# Patient Record
Sex: Female | Born: 1990 | Race: White | Hispanic: No | Marital: Single
Health system: Southern US, Community
[De-identification: ages and names within clinical notes are randomized; demographics above are authoritative.]

## PROBLEM LIST (undated history)

## (undated) HISTORY — PX: NASAL SINUS SURGERY: SHX719

## (undated) HISTORY — PX: ANTERIOR CRUCIATE LIGAMENT REPAIR: SHX115

## (undated) HISTORY — PX: CHOLECYSTECTOMY: SHX55

---
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
## 2010-04-05 DIAGNOSIS — M6750 Plica syndrome, unspecified knee: Secondary | ICD-10-CM | POA: Insufficient documentation

## 2012-02-28 DIAGNOSIS — Z9889 Other specified postprocedural states: Secondary | ICD-10-CM | POA: Insufficient documentation

## 2014-07-02 DIAGNOSIS — J0191 Acute recurrent sinusitis, unspecified: Secondary | ICD-10-CM | POA: Insufficient documentation

## 2014-07-02 DIAGNOSIS — J329 Chronic sinusitis, unspecified: Secondary | ICD-10-CM | POA: Insufficient documentation

## 2014-07-02 DIAGNOSIS — J3501 Chronic tonsillitis: Secondary | ICD-10-CM | POA: Insufficient documentation

## 2016-12-24 DIAGNOSIS — K219 Gastro-esophageal reflux disease without esophagitis: Secondary | ICD-10-CM | POA: Insufficient documentation

## 2019-12-03 ENCOUNTER — Inpatient Hospital Stay (HOSPITAL_COMMUNITY): Admission: RE | Admit: 2019-12-03 | Payer: Self-pay | Source: Ambulatory Visit

## 2019-12-03 ENCOUNTER — Ambulatory Visit: Payer: Self-pay

## 2019-12-04 ENCOUNTER — Other Ambulatory Visit: Payer: Self-pay

## 2019-12-04 ENCOUNTER — Ambulatory Visit
Admission: EM | Admit: 2019-12-04 | Discharge: 2019-12-04 | Disposition: A | Discharge status: Home / Self Care | Payer: 59

## 2019-12-04 ENCOUNTER — Ambulatory Visit (HOSPITAL_COMMUNITY): Payer: Self-pay

## 2019-12-04 ENCOUNTER — Encounter: Payer: Self-pay | Admitting: Emergency Medicine

## 2019-12-04 ENCOUNTER — Ambulatory Visit (HOSPITAL_COMMUNITY)
Admission: EM | Admit: 2019-12-04 | Discharge: 2019-12-04 | Disposition: A | Discharge status: Home / Self Care | Payer: Self-pay

## 2019-12-04 DIAGNOSIS — M546 Pain in thoracic spine: Secondary | ICD-10-CM

## 2019-12-04 MED ORDER — TIZANIDINE HCL 2 MG PO TABS
2.0000 mg | ORAL_TABLET | Freq: Three times a day (TID) | ORAL | 0 refills | Status: DC | PRN
Start: 2019-12-04 — End: 2022-04-10

## 2019-12-04 NOTE — ED Triage Notes (Signed)
Pain in left side/back pain   Pain with breathing, moving, lying down.  No known injury.  Patient refers to activities at work as well as helping family member moving.

## 2019-12-04 NOTE — ED Provider Notes (Signed)
[facility]    CSN: [id number] Arrival date & time: 12/04/19  6237      History   Chief Complaint Chief Complaint  Patient presents with   Flank Pain    HPI Ann Simon is a 29 y.o. female.   29 year old female comes in for 2 day history of left back/rib pain. No injury/trauma. However, prior to symptom onset, was moving heavy equipment with pushing/pulling/twisting motion, and thinks this may have started the pain. Mild dull pain at rest, worse with changes in position, deep breathing. Denies shortness of breath. Naproxen, tylenol with some relief.      History reviewed. No pertinent past medical history.  There are no problems to display for this patient.   Past Surgical History:  Procedure Laterality Date   ANTERIOR CRUCIATE LIGAMENT REPAIR Bilateral    CESAREAN SECTION     CHOLECYSTECTOMY     NASAL SINUS SURGERY      OB History   No obstetric history on file.      Home Medications    Prior to Admission medications   Medication Sig Start Date End Date Taking? Authorizing Provider  acetaminophen (TYLENOL) 325 MG tablet Take 650 mg by mouth every 6 (six) hours as needed.   Yes [provider]  tiZANidine (ZANAFLEX) 2 MG tablet Take 1 tablet (2 mg total) by mouth every 8 (eight) hours as needed for muscle spasms. 12/04/19   Belinda Fisher, PA-C    Family History Family History  Problem Relation Age of Onset   Hypertension Mother     Social History Social History   Tobacco Use   Smoking status: Never Smoker  Substance Use Topics   Alcohol use: Never   Drug use: Never     Allergies   Patient has no known allergies.   Review of Systems Review of Systems  Reason unable to perform ROS: See HPI as above.     Physical Exam Triage Vital Signs ED Triage Vitals  Enc Vitals Group     BP 12/04/19 0956 110/70     Pulse Rate 12/04/19 0956 73     Resp 12/04/19 0956 18     Temp 12/04/19 0956 97.9 F (36.6 C)     Temp  Source 12/04/19 0956 Oral     SpO2 12/04/19 0956 99 %     Weight --      Height --      Head Circumference --      Peak Flow --      Pain Score 12/04/19 0952 7     Pain Loc --      Pain Edu? --      Excl. in GC? --    No data found.  Updated Vital Signs BP 110/70 (BP Location: Left Arm)    Pulse 73    Temp 97.9 F (36.6 C) (Oral)    Resp 18    SpO2 99%   Physical Exam Constitutional:      General: She is not in acute distress.    Appearance: Normal appearance. She is well-developed. She is not toxic-appearing or diaphoretic.  HENT:     Head: Normocephalic and atraumatic.  Eyes:     Conjunctiva/sclera: Conjunctivae normal.     Pupils: Pupils are equal, round, and reactive to light.  Cardiovascular:     Rate and Rhythm: Normal rate and regular rhythm.     Heart sounds: No murmur heard.  No friction rub. No gallop.  Pulmonary:     Effort: Pulmonary effort is normal. No respiratory distress.     Comments: Speaking in full sentences without difficulty. LCTAB Chest:     Comments: No swelling, contusion, erythema. Some tenderness to palpation of anterior low ribs without crepitus, deformity.  Musculoskeletal:     Cervical back: Normal range of motion and neck supple.     Comments: No tenderness to palpation of spinous processes. Tenderness to palpation of parascapular area. Full ROM of neck, back, BUE. Strength 5/5. Sensation intact.   Skin:    General: Skin is warm and dry.  Neurological:     Mental Status: She is alert and oriented to person, place, and time.    UC Treatments / Results  Labs (all labs ordered are listed, but only abnormal results are displayed) Labs Reviewed - No data to display  EKG   Radiology No results found.  Procedures Procedures (including critical care time)  Medications Ordered in UC Medications - No data to display  Initial Impression / Assessment and Plan / UC Course  I have reviewed the triage vital signs and the nursing  notes.  Pertinent labs & imaging results that were available during my care of the patient were reviewed by me and considered in my medical decision making (see chart for details).    NSAID as directed. Muscle relaxant as needed. Ice/heat compresses. Expected course of healing discussed. Return precautions given.   Final Clinical Impressions(s) / UC Diagnoses   Final diagnoses:  Acute left-sided thoracic back pain   ED Prescriptions    Medication Sig Dispense Auth. Provider   tiZANidine (ZANAFLEX) 2 MG tablet Take 1 tablet (2 mg total) by mouth every 8 (eight) hours as needed for muscle spasms. 15 tablet Belinda Fisher, PA-C     PDMP not reviewed this encounter.   Belinda Fisher, PA-C 12/04/19 1019

## 2019-12-04 NOTE — Discharge Instructions (Signed)
Continue naproxen twice a day for the next 5-10 days. Tizanidine as needed, this can make you drowsy, so do not take if you are going to drive, operate heavy machinery, or make important decisions. Ice/heat compresses as needed. This can take up to 3-4 weeks to completely resolve, but you should be feeling better each week. Follow up with PCP/orthopedics if symptoms worsen, changes for reevaluation. If sudden chest pain, shortness of breath, go to the emergency department for further evaluation.

## 2019-12-26 DIAGNOSIS — H5203 Hypermetropia, bilateral: Secondary | ICD-10-CM | POA: Diagnosis not present

## 2019-12-26 DIAGNOSIS — H52223 Regular astigmatism, bilateral: Secondary | ICD-10-CM | POA: Diagnosis not present

## 2019-12-26 DIAGNOSIS — H10413 Chronic giant papillary conjunctivitis, bilateral: Secondary | ICD-10-CM | POA: Diagnosis not present

## 2020-02-10 ENCOUNTER — Ambulatory Visit: Payer: Self-pay

## 2020-02-10 ENCOUNTER — Telehealth: Payer: 59 | Admitting: Physician Assistant

## 2020-02-10 DIAGNOSIS — J329 Chronic sinusitis, unspecified: Secondary | ICD-10-CM | POA: Diagnosis not present

## 2020-02-10 MED ORDER — AMOXICILLIN-POT CLAVULANATE 875-125 MG PO TABS: 1.0000 | ORAL_TABLET | Freq: Two times a day (BID) | ORAL | 0 refills | Status: AC

## 2020-02-10 MED ORDER — AZITHROMYCIN 250 MG PO TABS: ORAL_TABLET | ORAL | 0 refills | Status: DC

## 2020-02-10 NOTE — Addendum Note (Signed)
Addended by: Ronnie Doss A on: 02/10/2020 09:22 AM   Modules accepted: Orders

## 2020-02-10 NOTE — Progress Notes (Signed)
We are sorry that you are not feeling well.  Here is how we plan to help!  Based on what you have shared with me it looks like you have sinusitis.  Sinusitis is inflammation and infection in the sinus cavities of the head.  Based on your presentation I believe you most likely have Acute Bacterial Sinusitis.  This is an infection caused by bacteria and is treated with antibiotics. I have prescribed Augmentin 875mg /125mg  one tablet twice daily with food, for 7 days. You may use an oral decongestant such as Mucinex D or if you have glaucoma or high blood pressure use plain Mucinex. Saline nasal spray help and can safely be used as often as needed for congestion.  Even if you have had a negative covid test recently, you should still stay home, away from others to be certain you do not pass any kind of illness to others in the case that this still may be a viral infection   If you develop worsening sinus pain, fever or notice severe headache and vision changes, or if symptoms are not better after completion of antibiotic, please schedule an appointment with a health care provider.    Sinus infections are not as easily transmitted as other respiratory infection, however we still recommend that you avoid close contact with loved ones, especially the very young and elderly.  Remember to wash your hands thoroughly throughout the day as this is the number one way to prevent the spread of infection!  Home Care:  Only take medications as instructed by your medical team.  Complete the entire course of an antibiotic.  Do not take these medications with alcohol.  A steam or ultrasonic humidifier can help congestion.  You can place a towel over your head and breathe in the steam from hot water coming from a faucet.  Avoid close contacts especially the very young and the elderly.  Cover your mouth when you cough or sneeze.  Always remember to wash your hands.  Get Help Right Away If:  You develop  worsening fever or sinus pain.  You develop a severe head ache or visual changes.  Your symptoms persist after you have completed your treatment plan.  Make sure you  Understand these instructions.  Will watch your condition.  Will get help right away if you are not doing well or get worse.  Your e-visit answers were reviewed by a board certified advanced clinical practitioner to complete your personal care plan.  Depending on the condition, your plan could have included both over the counter or prescription medications.  If there is a problem please reply  once you have received a response from your provider.  Your safety is important to .  If you have drug allergies check your prescription carefully.    You can use MyChart to ask questions about todays visit, request a non-urgent call back, or ask for a work or school excuse for 24 hours related to this e-Visit. If it has been greater than 24 hours you will need to follow up with your provider, or enter a new e-Visit to address those concerns.  You will get an e-mail in the next two days asking about your experience.  I hope that your e-visit has been valuable and will speed your recovery. Thank you for using e-visits.   5 minutes on chart

## 2020-03-23 ENCOUNTER — Telehealth: Payer: 59 | Admitting: Physician Assistant

## 2020-03-23 DIAGNOSIS — J329 Chronic sinusitis, unspecified: Secondary | ICD-10-CM | POA: Diagnosis not present

## 2020-03-23 MED ORDER — AZITHROMYCIN 500 MG PO TABS
500.0000 mg | ORAL_TABLET | Freq: Every day | ORAL | 0 refills | Status: AC
Start: 2020-03-23 — End: 2020-03-26

## 2020-03-23 NOTE — Progress Notes (Signed)
We are sorry that you are not feeling well.  Here is how we plan to help!  Based on what you have shared with me it looks like you have sinusitis.  Sinusitis is inflammation and infection in the sinus cavities of the head.  Based on your presentation I believe you most likely have Acute Bacterial Sinusitis.  This is an infection caused by bacteria and is treated with antibiotics. I have prescribed Azithromycin to take once daily for the next 3 days. You may use an oral decongestant such as Mucinex D or if you have glaucoma or high blood pressure use plain Mucinex. Saline nasal spray help and can safely be used as often as needed for congestion.  If you develop worsening sinus pain, fever or notice severe headache and vision changes, or if symptoms are not better after completion of antibiotic, please schedule an appointment with a health care provider.    Sinus infections are not as easily transmitted as other respiratory infection, however we still recommend that you avoid close contact with loved ones, especially the very young and elderly.  Remember to wash your hands thoroughly throughout the day as this is the number one way to prevent the spread of infection!  Home Care:  Only take medications as instructed by your medical team.  Complete the entire course of an antibiotic.  Do not take these medications with alcohol.  A steam or ultrasonic humidifier can help congestion.  You can place a towel over your head and breathe in the steam from hot water coming from a faucet.  Avoid close contacts especially the very young and the elderly.  Cover your mouth when you cough or sneeze.  Always remember to wash your hands.  Get Help Right Away If:  You develop worsening fever or sinus pain.  You develop a severe head ache or visual changes.  Your symptoms persist after you have completed your treatment plan.  Make sure you  Understand these instructions.  Will watch your  condition.  Will get help right away if you are not doing well or get worse.  Your e-visit answers were reviewed by a board certified advanced clinical practitioner to complete your personal care plan.  Depending on the condition, your plan could have included both over the counter or prescription medications.  If there is a problem please reply  once you have received a response from your provider.  Your safety is important to [place].  If you have drug allergies check your prescription carefully.    You can use MyChart to ask questions about today's visit, request a non-urgent call back, or ask for a work or school excuse for 24 hours related to this e-Visit. If it has been greater than 24 hours you will need to follow up with your provider, or enter a new e-Visit to address those concerns.  You will get an e-mail in the next two days asking about your experience.  I hope that your e-visit has been valuable and will speed your recovery. Thank you for using e-visits.  Approximately 5 minutes was spent documenting and reviewing patient's chart.

## 2020-03-25 ENCOUNTER — Telehealth: Payer: 59 | Admitting: Emergency Medicine

## 2020-03-25 DIAGNOSIS — J329 Chronic sinusitis, unspecified: Secondary | ICD-10-CM

## 2020-03-25 MED ORDER — AZITHROMYCIN 250 MG PO TABS
250.0000 mg | ORAL_TABLET | Freq: Every day | ORAL | 0 refills | Status: DC
Start: 2020-03-25 — End: 2021-04-26

## 2020-03-25 MED ORDER — AMOXICILLIN-POT CLAVULANATE 875-125 MG PO TABS: 1.0000 | ORAL_TABLET | Freq: Two times a day (BID) | ORAL | 0 refills | Status: AC

## 2020-03-25 NOTE — Addendum Note (Signed)
Addended by: Graciella Freer A on: 03/25/2020 09:04 AM   Modules accepted: Orders

## 2020-03-25 NOTE — Progress Notes (Signed)
We are sorry that you are not feeling well.  Here is how we plan to help!  Based on what you have shared with me it looks like you have sinusitis.  Sinusitis is inflammation and infection in the sinus cavities of the head.  Based on your presentation I believe you most likely have Acute Bacterial Sinusitis.  This is an infection caused by bacteria and is treated with antibiotics. I have prescribed Augmentin 875mg /125mg  one tablet twice daily with food, for 7 days. You may use an oral decongestant such as Mucinex D or if you have glaucoma or high blood pressure use plain Mucinex. Saline nasal spray help and can safely be used as often as needed for congestion.  If you develop worsening sinus pain, fever or notice severe headache and vision changes, or if symptoms are not better after completion of antibiotic, please schedule an appointment with a health care provider.    Sinus infections are not as easily transmitted as other respiratory infection, however we still recommend that you avoid close contact with loved ones, especially the very young and elderly.  Remember to wash your hands thoroughly throughout the day as this is the number one way to prevent the spread of infection!  Home Care:  Only take medications as instructed by your medical team.  Complete the entire course of an antibiotic.  Do not take these medications with alcohol.  A steam or ultrasonic humidifier can help congestion.  You can place a towel over your head and breathe in the steam from hot water coming from a faucet.  Avoid close contacts especially the very young and the elderly.  Cover your mouth when you cough or sneeze.  Always remember to wash your hands.  Get Help Right Away If:  You develop worsening fever or sinus pain.  You develop a severe head ache or visual changes.  Your symptoms persist after you have completed your treatment plan.  Make sure you  Understand these instructions.  Will watch your  condition.  Will get help right away if you are not doing well or get worse.  Your e-visit answers were reviewed by a board certified advanced clinical practitioner to complete your personal care plan.  Depending on the condition, your plan could have included both over the counter or prescription medications.  If there is a problem please reply  once you have received a response from your provider.  Your safety is important to .  If you have drug allergies check your prescription carefully.    You can use MyChart to ask questions about today's visit, request a non-urgent call back, or ask for a work or school excuse for 24 hours related to this e-Visit. If it has been greater than 24 hours you will need to follow up with your provider, or enter a new e-Visit to address those concerns.  You will get an e-mail in the next two days asking about your experience.  I hope that your e-visit has been valuable and will speed your recovery. Thank you for using e-visits.  I spent 5-10 minutes reviewing patient's chart and medical information.

## 2020-03-29 ENCOUNTER — Ambulatory Visit: Payer: Self-pay

## 2020-04-02 ENCOUNTER — Ambulatory Visit: Payer: Self-pay

## 2020-05-04 ENCOUNTER — Telehealth: Payer: 59 | Admitting: Emergency Medicine

## 2020-05-04 DIAGNOSIS — J329 Chronic sinusitis, unspecified: Secondary | ICD-10-CM

## 2020-05-04 NOTE — Progress Notes (Signed)
Time spent: 10 min  Review of your medical chart shows that you have history of recurrent sinusitis.  This is your third E-visit in the last 3 months for similar symptoms.  You have been treated with a Z-pack each month since September.   Repeat use of the same antibiotic is detrimental and can lead to complications like resistance bacterial infections, misdiagnosis, diarrhea and other GI issues.  Based on what you shared with me, I feel your condition warrants further evaluation and I recommend that you be seen for a face to face visit.  There may be another underlying cause to your recurrent symptoms.  The scope of E-visits is limited as we cannot perform a physical exam and exclude other causes to your symptoms.    Please contact your primary care physician practice or Ear, Nose, Throat practice to be seen. Many offices offer virtual options to be seen via video if you are not comfortable going in person to a medical facility at this time.  If you do not have a PCP, [facility] offers a free physician referral service available at [phone]. Our trained staff has the experience, knowledge and resources to put you in touch with a physician who is right for you.   You also have the option of a video visit through [email or web address]  If you are having a true medical emergency please call 911.  NOTE: If you entered your credit card information for this eVisit, you will not be charged. You may see a "hold" on your card for the $35 but that hold will drop off and you will not have a charge processed.  Your e-visit answers were reviewed by a board certified advanced clinical practitioner to complete your personal care plan.  Thank you for using e-Visits.

## 2020-05-14 ENCOUNTER — Ambulatory Visit: Admit: 2020-05-14 | Discharge status: Against Medical Advice | Payer: 59

## 2020-05-14 DIAGNOSIS — J0101 Acute recurrent maxillary sinusitis: Secondary | ICD-10-CM | POA: Diagnosis not present

## 2020-05-18 ENCOUNTER — Other Ambulatory Visit: Payer: Self-pay

## 2020-05-18 ENCOUNTER — Ambulatory Visit: Payer: Self-pay

## 2020-05-18 ENCOUNTER — Emergency Department (INDEPENDENT_AMBULATORY_CARE_PROVIDER_SITE_OTHER)
Admission: RE | Admit: 2020-05-18 | Discharge: 2020-05-18 | Disposition: A | Discharge status: Home / Self Care | Payer: 59 | Source: Ambulatory Visit | Attending: Internal Medicine | Admitting: Internal Medicine

## 2020-05-18 ENCOUNTER — Telehealth: Payer: Self-pay | Admitting: Emergency Medicine

## 2020-05-18 VITALS — BP 117/73 | HR 68 | Temp 98.7°F | Resp 15 | Ht 60.0 in | Wt 110.0 lb

## 2020-05-18 DIAGNOSIS — H6021 Malignant otitis externa, right ear: Secondary | ICD-10-CM

## 2020-05-18 MED ORDER — ACETIC ACID 2 % OT SOLN: 4.0000 [drp] | Freq: Three times a day (TID) | OTIC | 0 refills | Status: DC

## 2020-05-18 NOTE — Discharge Instructions (Signed)
Avoid cleaning your ears with Q-tips on a daily basis Apply the medications as directed If you have increased pain, ear discharge, increased swelling, loss of hearing or difficulty hearing please return to the urgent care to be reevaluated.

## 2020-05-18 NOTE — ED Provider Notes (Signed)
[facility]    CSN: [id number] Arrival date & time: 05/18/20  1508      History   Chief Complaint Chief Complaint  Patient presents with   Otalgia    bilateral    HPI Ann Simon is a 29 y.o. female comes to the urgent care with complaints of right ear pain.  Patient was recently treated for otitis media with a Z-Pak and a course of steroids.  Patient completed a course of azithromycin today.  She complains of worsening right ear pain with no ear discharge.  No ringing in ears.  No hearing loss.  Patient denies any vomiting or diarrhea.  No fever or chills.  No sore throat.   HPI  History reviewed. No pertinent past medical history.  There are no problems to display for this patient.   History reviewed. No pertinent surgical history.  OB History   No obstetric history on file.      Home Medications    Prior to Admission medications   Medication Sig Start Date End Date Taking? Authorizing Provider  fluticasone (FLONASE) 50 MCG/ACT nasal spray Place into both nostrils. 05/10/20  Yes [provider]  loratadine (CLARITIN) 10 MG tablet Take 10 mg by mouth daily. 05/07/20  Yes [provider]  acetic acid 2 % otic solution Place 4 drops into the right ear 3 (three) times daily. 05/18/20   Marygrace Sandoval, Britta Mccreedy, MD    Family History Family History  Problem Relation Age of Onset   Healthy Mother    Healthy Father     Social History Social History   Tobacco Use   Smoking status: Never Smoker   Smokeless tobacco: Never Used  Building services engineer Use: Never used  Substance Use Topics   Alcohol use: Not Currently   Drug use: Never     Allergies   Patient has no known allergies.   Review of Systems Review of Systems  Constitutional: Negative.   HENT: Positive for ear pain. Negative for congestion, ear discharge, facial swelling and sore throat.   Eyes: Negative.      Physical Exam Triage Vital Signs ED Triage Vitals   Enc Vitals Group     BP 05/18/20 1540 117/73     Pulse Rate 05/18/20 1540 68     Resp 05/18/20 1540 15     Temp 05/18/20 1540 98.7 F (37.1 C)     Temp Source 05/18/20 1540 Oral     SpO2 05/18/20 1540 97 %     Weight 05/18/20 1546 110 lb (49.9 kg)     Height 05/18/20 1546 5' (1.524 m)     Head Circumference --      Peak Flow --      Pain Score 05/18/20 1541 5     Pain Loc --      Pain Edu? --      Excl. in GC? --    No data found.  Updated Vital Signs BP 117/73 (BP Location: Right Arm)    Pulse 68    Temp 98.7 F (37.1 C) (Oral)    Resp 15    Ht 5' (1.524 m)    Wt 49.9 kg    SpO2 97%    BMI 21.48 kg/m   Visual Acuity Right Eye Distance:   Left Eye Distance:   Bilateral Distance:    Right Eye Near:   Left Eye Near:    Bilateral Near:     Physical Exam Vitals and  nursing note reviewed.  Constitutional:      General: She is not in acute distress.    Appearance: She is not ill-appearing.  HENT:     Right Ear: Tympanic membrane normal.     Left Ear: Tympanic membrane normal.     Ears:     Comments: Mild erythema in the right external ear canal.  No discharge noted. Eyes:     Pupils: Pupils are equal, round, and reactive to light.  Cardiovascular:     Rate and Rhythm: Normal rate and regular rhythm.     Pulses: Normal pulses.     Heart sounds: Normal heart sounds.  Pulmonary:     Effort: Pulmonary effort is normal.     Breath sounds: Normal breath sounds.  Abdominal:     General: Bowel sounds are normal.  Neurological:     Mental Status: She is alert.      UC Treatments / Results  Labs (all labs ordered are listed, but only abnormal results are displayed) Labs Reviewed - No data to display  EKG   Radiology No results found.  Procedures Procedures (including critical care time)  Medications Ordered in UC Medications - No data to display  Initial Impression / Assessment and Plan / UC Course  I have reviewed the triage vital signs and the nursing  notes.  Pertinent labs & imaging results that were available during my care of the patient were reviewed by me and considered in my medical decision making (see chart for details).    1.  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 Return precautions given. Final Clinical Impressions(s) / UC Diagnoses   Final diagnoses:  Acute malignant otitis externa of right ear     Discharge Instructions     Avoid cleaning your ears with Q-tips on a daily basis Apply the medications as directed If you have increased pain, ear discharge, increased swelling, loss of hearing or difficulty hearing please return to the urgent care to be reevaluated.   ED Prescriptions    Medication Sig Dispense Auth. Provider   acetic acid 2 % otic solution Place 4 drops into the right ear 3 (three) times daily. 15 mL Torryn Fiske, Britta Mccreedy, MD     PDMP not reviewed this encounter.   Merrilee Jansky, MD 05/18/20 [id number]

## 2020-05-18 NOTE — Telephone Encounter (Signed)
Message left with request for pt to call back with reason for visit

## 2020-05-18 NOTE — ED Triage Notes (Addendum)
Bilat ear pain since last Wed Treated at home w/ a z-pak/seen in another urgent care in [place]- last dose this am Needs a work note - works in the OR at [employer] Given prednisone today - does not like the way it made her feel Denies fever or chills Pfizer vaccine - needs booster

## 2020-05-19 ENCOUNTER — Encounter: Payer: Self-pay | Admitting: Emergency Medicine

## 2020-06-19 ENCOUNTER — Other Ambulatory Visit: Payer: Self-pay | Admitting: Orthopaedic Surgery

## 2020-06-19 MED ORDER — AZITHROMYCIN 250 MG PO TABS
ORAL_TABLET | ORAL | 0 refills | Status: DC
Start: 2020-06-19 — End: 2020-12-28

## 2020-06-20 ENCOUNTER — Ambulatory Visit: Payer: Self-pay

## 2020-07-08 ENCOUNTER — Ambulatory Visit: Payer: Self-pay

## 2020-07-08 DIAGNOSIS — B9721 SARS-associated coronavirus as the cause of diseases classified elsewhere: Secondary | ICD-10-CM | POA: Diagnosis not present

## 2020-07-08 DIAGNOSIS — M79662 Pain in left lower leg: Secondary | ICD-10-CM | POA: Diagnosis not present

## 2020-07-12 ENCOUNTER — Encounter: Payer: 59 | Admitting: Family

## 2020-07-12 NOTE — Progress Notes (Signed)
Patient did not show for appointment.   

## 2020-07-26 ENCOUNTER — Telehealth: Payer: 59 | Admitting: Nurse Practitioner

## 2020-07-26 DIAGNOSIS — J329 Chronic sinusitis, unspecified: Secondary | ICD-10-CM

## 2020-07-26 NOTE — Progress Notes (Signed)
Based on what you shared with me it looks like you have recurrent sinus infection. ,that should be evaluated in a face to face office visit. According to your chart you have had a z[pak on October as well as one in January. It would not be beneficial to you to have one again this soon. You will have to be seen in order to be treated.    NOTE: If you entered your credit card information for this eVisit, you will not be charged. You may see a "hold" on your card for the $35 but that hold will drop off and you will not have a charge processed.  If you are having a true medical emergency please call 911.     For an urgent face to face visit, [facility] has four urgent care centers for your convenience:   . [facility]    [phone]                  Get Driving Directions  [address] [place], [place] [address] . 10 am to 8 pm Monday-Friday . 12 pm to 8 pm Saturday-Sunday   . [facility] at [facility]  [phone]                  Get Driving Directions  [address], [address] [place], [place] [address] . 8 am to 8 pm Monday-Friday . 9 am to 6 pm Saturday . 11 am to 6 pm Sunday   . [facility] at [facility]  [phone]                  Get Driving Directions   [address] [place], [place] [address] . 8 am to 8 pm Monday-Friday . 8 am to 4 pm Saturday-Sunday    . [facility] at [facility] Directions  [phone]  [address] [place], [place] [address]  . Monday-Friday, 12 PM to 6 PM    Your e-visit answers were reviewed by a board certified advanced clinical practitioner to complete your personal care plan.  Thank you for using e-Visits.

## 2020-12-28 ENCOUNTER — Other Ambulatory Visit: Payer: Self-pay | Admitting: Orthopaedic Surgery

## 2020-12-28 MED ORDER — AZITHROMYCIN 250 MG PO TABS: ORAL_TABLET | ORAL | 0 refills | Status: DC

## 2021-03-03 ENCOUNTER — Other Ambulatory Visit: Payer: Self-pay | Admitting: Orthopaedic Surgery

## 2021-04-22 ENCOUNTER — Encounter: Payer: Self-pay | Admitting: Orthopaedic Surgery

## 2021-04-22 ENCOUNTER — Ambulatory Visit (INDEPENDENT_AMBULATORY_CARE_PROVIDER_SITE_OTHER): Payer: 59 | Admitting: Orthopaedic Surgery

## 2021-04-22 ENCOUNTER — Other Ambulatory Visit: Payer: Self-pay

## 2021-04-22 ENCOUNTER — Ambulatory Visit (INDEPENDENT_AMBULATORY_CARE_PROVIDER_SITE_OTHER): Payer: Self-pay

## 2021-04-22 DIAGNOSIS — M1712 Unilateral primary osteoarthritis, left knee: Secondary | ICD-10-CM

## 2021-04-22 MED ORDER — DICLOFENAC SODIUM 2 % EX SOLN
2.0000 g | Freq: Two times a day (BID) | CUTANEOUS | 3 refills | Status: DC | PRN
Start: 2021-04-22 — End: 2022-04-10

## 2021-04-22 NOTE — Progress Notes (Signed)
Office Visit Note   Patient: Ann Simon           Date of Birth: 07-Jul-1990           MRN: [id number] Visit Date: 04/22/2021              Requested by: No referring provider defined for this encounter. PCP: Patient, No Pcp Per (Inactive)   Assessment & Plan: Visit Diagnoses:  1. Primary osteoarthritis of left knee     Plan: Based on findings impression is chondromalacia patella and patellar maltracking.  Based on findings I do not feel that she is in need of a surgery.  I recommend outpatient physical therapy for VMO strengthening and KT taping.  I sent in prescription for Pennsaid ointment.  She will use her knee brace when she needs it.  Continue to take ibuprofen as needed.  Follow-up as needed.  Follow-Up Instructions: No follow-ups on file.   Orders:  Orders Placed This Encounter  Procedures   XR KNEE 3 VIEW LEFT   Ambulatory referral to Physical Therapy   Meds ordered this encounter  Medications   Diclofenac Sodium (PENNSAID) 2 % SOLN    Sig: Apply 2 g topically 2 (two) times daily as needed (to affected area).    Dispense:  112 g    Refill:  3      Procedures: No procedures performed   Clinical Data: No additional findings.   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  She was told in the past that she needed a partial knee replacement due to being bone-on-bone.  She states that she has had left knee pain for about a month.  The pain is on both sides of the patella.  She has had a prior knee arthroscopy for a medial plica resection.  She feels a pop underneath the kneecap with knee extension.  She has had cortisone injections in 2014 and 15 which helped some.  During flareups she will wear compression sleeve.  Ibuprofen instilled does provide significant relief when she  takes it.  Denies any mechanical symptoms.   Review of Systems  Constitutional: Negative.   HENT: Negative.    Eyes: Negative.   Respiratory: Negative.    Cardiovascular: Negative.   Endocrine: Negative.   Musculoskeletal: Negative.   Neurological: Negative.   Hematological: Negative.   Psychiatric/Behavioral: Negative.    All other systems reviewed and are negative.   Objective: Vital Signs: There were no vitals taken for this visit.  Physical Exam Vitals and nursing note reviewed.  Constitutional:      Appearance: She is well-developed.  Pulmonary:     Effort: Pulmonary effort is normal.  Skin:    General: Skin is warm.     Capillary Refill: Capillary refill takes less than 2 seconds.  Neurological:     Mental Status: She is alert and oriented to person, place, and time.  Psychiatric:        Behavior: Behavior normal.        Thought Content: Thought content normal.        Judgment: Judgment normal.    Ortho Exam  Left knee examination shows no joint effusion.  There is a noticeable pop and clunk with knee extension.  Negative J sign.  Negative  patellar apprehension.  Collaterals and cruciates are stable.  No joint line tenderness.  Normal range of motion.  No significant patellofemoral crepitus.  Specialty Comments:  No specialty comments available.  Imaging: XR KNEE 3 VIEW LEFT  Result Date: 04/22/2021 No acute or structural abnormalities    PMFS History: Patient Active Problem List   Diagnosis Date Noted   Primary osteoarthritis of left knee 04/22/2021   History reviewed. No pertinent past medical history.  Family History  Problem Relation Age of Onset   Healthy Mother    Healthy Father    Hypertension Mother     Past Surgical History:  Procedure Laterality Date   ANTERIOR CRUCIATE LIGAMENT REPAIR Bilateral    CESAREAN SECTION     CHOLECYSTECTOMY     NASAL SINUS SURGERY     Social History   Occupational History   Not on file  Tobacco Use    Smoking status: Never   Smokeless tobacco: Never  Vaping Use   Vaping Use: Never used  Substance and Sexual Activity   Alcohol use: Not Currently   Drug use: Never   Sexual activity: Not on file

## 2021-04-26 ENCOUNTER — Other Ambulatory Visit: Payer: Self-pay

## 2021-04-26 ENCOUNTER — Ambulatory Visit
Admission: RE | Admit: 2021-04-26 | Discharge: 2021-04-26 | Disposition: A | Discharge status: Home / Self Care | Payer: Self-pay | Source: Ambulatory Visit | Attending: Emergency Medicine | Admitting: Emergency Medicine

## 2021-04-26 VITALS — BP 116/75 | HR 74 | Temp 98.6°F | Resp 16

## 2021-04-26 DIAGNOSIS — J321 Chronic frontal sinusitis: Secondary | ICD-10-CM

## 2021-04-26 MED ORDER — AZITHROMYCIN 250 MG PO TABS
250.0000 mg | ORAL_TABLET | Freq: Every day | ORAL | 0 refills | Status: DC
Start: 2021-04-26 — End: 2021-07-15

## 2021-04-26 MED ORDER — PREDNISONE 10 MG (21) PO TBPK
ORAL_TABLET | Freq: Every day | ORAL | 0 refills | Status: DC
Start: 2021-04-26 — End: 2021-08-31

## 2021-04-26 NOTE — ED Provider Notes (Signed)
[facility]    CSN: [id number] Arrival date & time: 04/26/21  0915      History   Chief Complaint Chief Complaint  Patient presents with   Facial Pain    HPI Ann Simon is a 30 y.o. female.   Pt here for sinus pressure for 1 week. She is prone to sinus infections due to nasal surgery in the past. Has not had one in several months. Asking for z pack and steroids. Has taken Claritin daily and Flonase with no relief. Denies any cough, no congestion, no fever, no n/v/d    History reviewed. No pertinent past medical history.  Patient Active Problem List   Diagnosis Date Noted   Primary osteoarthritis of left knee 04/22/2021    Past Surgical History:  Procedure Laterality Date   ANTERIOR CRUCIATE LIGAMENT REPAIR Bilateral    CESAREAN SECTION     CHOLECYSTECTOMY     NASAL SINUS SURGERY      OB History   No obstetric history on file.      Home Medications    Prior to Admission medications   Medication Sig Start Date End Date Taking? Authorizing Provider  predniSONE (STERAPRED UNI-PAK 21 TAB) 10 MG (21) TBPK tablet Take by mouth daily. Take 6 tabs by mouth daily  for 2 days, then 5 tabs for 2 days, then 4 tabs for 2 days, then 3 tabs for 2 days, 2 tabs for 2 days, then 1 tab by mouth daily for 2 days 04/26/21  Yes Coralyn Mark, NP  acetaminophen (TYLENOL) 325 MG tablet Take 650 mg by mouth every 6 (six) hours as needed.    [provider]  acetic acid 2 % otic solution Place 4 drops into the right ear 3 (three) times daily. 05/18/20   Lamptey, Britta Mccreedy, MD  azithromycin (ZITHROMAX) 250 MG tablet Take 1 tablet (250 mg total) by mouth daily. Take first 2 tablets together, then 1 every day until finished. 04/26/21   Coralyn Mark, NP  Diclofenac Sodium (PENNSAID) 2 % SOLN Apply 2 g topically 2 (two) times daily as needed (to affected area). 04/22/21   Tarry Kos, MD  fluticasone (FLONASE) 50 MCG/ACT nasal spray Place into both nostrils.  05/10/20   [provider]  loratadine (CLARITIN) 10 MG tablet Take 10 mg by mouth daily. 05/07/20   [provider]  tiZANidine (ZANAFLEX) 2 MG tablet Take 1 tablet (2 mg total) by mouth every 8 (eight) hours as needed for muscle spasms. 12/04/19   Belinda Fisher, PA-C    Family History Family History  Problem Relation Age of Onset   Healthy Mother    Healthy Father    Hypertension Mother     Social History Social History   Tobacco Use   Smoking status: Never   Smokeless tobacco: Never  Vaping Use   Vaping Use: Never used  Substance Use Topics   Alcohol use: Not Currently   Drug use: Never     Allergies   Varicella virus vaccine live   Review of Systems Review of Systems  Constitutional:  Positive for fever. Negative for activity change, appetite change and chills.  HENT:  Positive for facial swelling, postnasal drip, rhinorrhea, sinus pressure, sinus pain and sneezing. Negative for congestion, dental problem, drooling, ear pain and sore throat.   Eyes: Negative.   Respiratory: Negative.    Cardiovascular: Negative.   Gastrointestinal: Negative.   Genitourinary: Negative.   Neurological: Negative.  Physical Exam Triage Vital Signs ED Triage Vitals  Enc Vitals Group     BP 04/26/21 0926 116/75     Pulse Rate 04/26/21 0926 74     Resp 04/26/21 0926 16     Temp 04/26/21 0926 98.6 F (37 C)     Temp Source 04/26/21 0926 Oral     SpO2 04/26/21 0926 98 %     Weight --      Height --      Head Circumference --      Peak Flow --      Pain Score 04/26/21 0927 6     Pain Loc --      Pain Edu? --      Excl. in GC? --    No data found.  Updated Vital Signs BP 116/75 (BP Location: Right Arm)   Pulse 74   Temp 98.6 F (37 C) (Oral)   Resp 16   SpO2 98%   Visual Acuity Right Eye Distance:   Left Eye Distance:   Bilateral Distance:    Right Eye Near:   Left Eye Near:    Bilateral Near:     Physical Exam Constitutional:      Appearance:  Normal appearance. She is normal weight.  HENT:     Right Ear: Tympanic membrane normal.     Left Ear: Tympanic membrane normal.     Nose: Congestion and rhinorrhea present.     Mouth/Throat:     Mouth: Mucous membranes are moist.  Eyes:     Pupils: Pupils are equal, round, and reactive to light.  Cardiovascular:     Rate and Rhythm: Normal rate.  Pulmonary:     Effort: Pulmonary effort is normal.  Abdominal:     General: Abdomen is flat.  Musculoskeletal:     Cervical back: Normal range of motion.  Skin:    General: Skin is warm.  Neurological:     Mental Status: She is alert.     UC Treatments / Results  Labs (all labs ordered are listed, but only abnormal results are displayed) Labs Reviewed - No data to display  EKG   Radiology No results found.  Procedures Procedures (including critical care time)  Medications Ordered in UC Medications - No data to display  Initial Impression / Assessment and Plan / UC Course  I have reviewed the triage vital signs and the nursing notes.  Pertinent labs & imaging results that were available during my care of the patient were reviewed by me and considered in my medical decision making (see chart for details).     Take meds with food Use saline flush as needed for sinus  Take motrin or tylenol as needed Use a humidifier as needed  Final Clinical Impressions(s) / UC Diagnoses   Final diagnoses:  Chronic frontal sinusitis     Discharge Instructions      Take meds with food Use saline flush as needed for sinus  Take motrin or tylenol as needed Use a humidifier as needed     ED Prescriptions     Medication Sig Dispense Auth. Provider   azithromycin (ZITHROMAX) 250 MG tablet Take 1 tablet (250 mg total) by mouth daily. Take first 2 tablets together, then 1 every day until finished. 6 tablet Maple Mirza L, NP   predniSONE (STERAPRED UNI-PAK 21 TAB) 10 MG (21) TBPK tablet Take by mouth daily. Take 6 tabs by  mouth daily  for 2 days, then 5 tabs for  2 days, then 4 tabs for 2 days, then 3 tabs for 2 days, 2 tabs for 2 days, then 1 tab by mouth daily for 2 days 42 tablet Coralyn Mark, NP      PDMP not reviewed this encounter.   Coralyn Mark, NP 04/26/21 [phone]

## 2021-04-26 NOTE — ED Triage Notes (Signed)
Pt present facial pain with nasal congestion and drainage. Symptom started a week ago. Pt state that her mucous is thick and green alone with teeth pain.

## 2021-04-26 NOTE — Discharge Instructions (Addendum)
Take meds with food Use saline flush as needed for sinus  Take motrin or tylenol as needed Use a humidifier as needed

## 2021-06-23 ENCOUNTER — Inpatient Hospital Stay: Admission: RE | Admit: 2021-06-23 | Payer: Self-pay | Source: Ambulatory Visit

## 2021-06-23 ENCOUNTER — Ambulatory Visit: Payer: Self-pay

## 2021-07-04 ENCOUNTER — Ambulatory Visit: Payer: Self-pay | Admitting: Orthopedic Surgery

## 2021-07-05 ENCOUNTER — Ambulatory Visit: Admit: 2021-07-05 | Discharge status: Absent without leave | Payer: Self-pay

## 2021-07-15 ENCOUNTER — Other Ambulatory Visit: Payer: Self-pay | Admitting: Orthopaedic Surgery

## 2021-07-15 MED ORDER — AZITHROMYCIN 250 MG PO TABS: 250.0000 mg | ORAL_TABLET | Freq: Every day | ORAL | 0 refills | Status: DC

## 2021-08-25 ENCOUNTER — Ambulatory Visit: Payer: Self-pay

## 2021-08-26 ENCOUNTER — Telehealth: Payer: Self-pay | Admitting: Physician Assistant

## 2021-08-26 ENCOUNTER — Ambulatory Visit: Payer: Self-pay

## 2021-08-26 ENCOUNTER — Other Ambulatory Visit: Payer: Self-pay | Admitting: Orthopaedic Surgery

## 2021-08-26 ENCOUNTER — Encounter: Payer: Self-pay | Admitting: Physician Assistant

## 2021-08-26 DIAGNOSIS — J019 Acute sinusitis, unspecified: Secondary | ICD-10-CM

## 2021-08-26 MED ORDER — AZITHROMYCIN 500 MG PO TABS: 500.0000 mg | ORAL_TABLET | Freq: Every day | ORAL | 0 refills | Status: AC

## 2021-08-26 MED ORDER — GUAIFENESIN ER 600 MG PO TB12: 600.0000 mg | ORAL_TABLET | Freq: Two times a day (BID) | ORAL | 0 refills | Status: DC

## 2021-08-26 NOTE — Progress Notes (Signed)
[name], Simon are scheduled for a virtual visit with your provider today.   ? ?Just as we do with appointments in the office, we must obtain your consent to participate.  Your consent will be active for this visit and any virtual visit you may have with one of our providers in the next 365 days.   ? ?If you have a MyChart account, I can also send a copy of this consent to you electronically.  All virtual visits are billed to your insurance company just like a traditional visit in the office.  As this is a virtual visit, video technology does not allow for your provider to perform a traditional examination.  This may limit your provider's ability to fully assess your condition.  If your provider identifies any concerns that need to be evaluated in person or the need to arrange testing such as labs, EKG, etc, we will make arrangements to do so.   ? ?Although advances in technology are sophisticated, we cannot ensure that it will always work on either your end or our end.  If the connection with a video visit is poor, we may have to switch to a telephone visit.  With either a video or telephone visit, we are not always able to ensure that we have a secure connection.   I need to obtain your verbal consent now.   Are you willing to proceed with your visit today?  ? ?Ann Simon has provided verbal consent on 08/26/2021 for a virtual visit (video or telephone). ? ? ?Ann Carneiro Manfred Shirts, PA-C ?08/26/2021  7:53 AM ? ? ?Date:  08/26/2021  ? ?ID:  Ann Simon, DOB Jan 28, 1991, MRN [id number] ? ?Patient Location: Home ?Provider Location: Home Office ? ? ?Participants: Patient and Provider for Visit and Wrap up ? ?Method of visit: Video  ?Location of Patient: Home ?Location of Provider: Home Office ?Consent was obtain for visit over the video. ?Services rendered by provider: Visit was performed via video ? ?A video enabled telemedicine application was used and I verified that I am speaking with the correct person using two  identifiers. ? ?PCP:  Patient, No Pcp Per (Inactive)  ? ?Chief Complaint:  sinus problems ? ?History of Present Illness:   ? ?Ann Simon is a 31 y.o. female with history as stated below. ?Presents video telehealth for an acute care visit ? ?Pt c/o congestion, rhinorrhea, sinus pressure to the frontal and maxillary sinuses for the last week but worse over the last 3-4 days. Denies fevers, chills, sore throat, cough. Has hx multiple sinus surgeries.  ? ?Past Medical, Surgical, Social History, Allergies, and Medications have been Reviewed. ? ?History reviewed. No pertinent past medical history. ? ?Current Meds  ?Medication Sig  ? azithromycin (ZITHROMAX) 500 MG tablet Take 1 tablet (500 mg total) by mouth daily for 3 days.  ? guaiFENesin (MUCINEX) 600 MG 12 hr tablet Take 1 tablet (600 mg total) by mouth 2 (two) times daily.  ?  ? ?Allergies:   Varicella virus vaccine live  ? ?ROS ?See HPI for history of present illness. ? ?Physical Exam ?Constitutional:   ?   Appearance: Normal appearance.  ?Neurological:  ?   Mental Status: She is alert.  ? ?          ?MDM: suspected bacterial sinusitis. Abx and mucinex sent. Advised continuation of her allergy med and fluticasone.  ? ?There are no diagnoses linked to this encounter. ? ? ?Time:   ?Today, I have spent 8 minutes with  the patient with telehealth technology discussing the above problems, reviewing the chart, previous notes, medications and orders.  ? ? ?Tests Ordered: ?No orders of the defined types were placed in this encounter. ? ? ?Medication Changes: ?Meds ordered this encounter  ?Medications  ? azithromycin (ZITHROMAX) 500 MG tablet  ?  Sig: Take 1 tablet (500 mg total) by mouth daily for 3 days.  ?  Dispense:  3 tablet  ?  Refill:  0  ?  Order Specific Question:   Supervising Provider  ?  Answer:   Eber Hong [[id number]]  ? guaiFENesin (MUCINEX) 600 MG 12 hr tablet  ?  Sig: Take 1 tablet (600 mg total) by mouth 2 (two) times daily.  ?  Dispense:  6 tablet  ?   Refill:  0  ?  Order Specific Question:   Supervising Provider  ?  Answer:   Eber Hong [[id number]]  ? ? ? ?Disposition:  Follow up  ?Signed, ?Ann Rachel Manfred Shirts, PA-C  ?08/26/2021 7:53 AM    ? ? ?

## 2021-08-26 NOTE — Patient Instructions (Signed)
?Ann Simon, thank you for joining Karrie Meres, PA-C for today's virtual visit.  While this provider is not your primary care provider (PCP), if your PCP is located in our provider database this encounter information will be shared with them immediately following your visit. ? ?Consent: ?(Patient) Ann Simon provided verbal consent for this virtual visit at the beginning of the encounter. ? ?Current Medications: ? ?Current Outpatient Medications:  ?  azithromycin (ZITHROMAX) 500 MG tablet, Take 1 tablet (500 mg total) by mouth daily for 3 days., Disp: 3 tablet, Rfl: 0 ?  guaiFENesin (MUCINEX) 600 MG 12 hr tablet, Take 1 tablet (600 mg total) by mouth 2 (two) times daily., Disp: 6 tablet, Rfl: 0 ?  acetaminophen (TYLENOL) 325 MG tablet, Take 650 mg by mouth every 6 (six) hours as needed., Disp: , Rfl:  ?  acetic acid 2 % otic solution, Place 4 drops into the right ear 3 (three) times daily., Disp: 15 mL, Rfl: 0 ?  azithromycin (ZITHROMAX) 250 MG tablet, Take 1 tablet (250 mg total) by mouth daily. Take first 2 tablets together, then 1 every day until finished., Disp: 6 tablet, Rfl: 0 ?  Diclofenac Sodium (PENNSAID) 2 % SOLN, Apply 2 g topically 2 (two) times daily as needed (to affected area)., Disp: 112 g, Rfl: 3 ?  fluticasone (FLONASE) 50 MCG/ACT nasal spray, Place into both nostrils., Disp: , Rfl:  ?  loratadine (CLARITIN) 10 MG tablet, Take 10 mg by mouth daily., Disp: , Rfl:  ?  predniSONE (STERAPRED UNI-PAK 21 TAB) 10 MG (21) TBPK tablet, Take by mouth daily. Take 6 tabs by mouth daily  for 2 days, then 5 tabs for 2 days, then 4 tabs for 2 days, then 3 tabs for 2 days, 2 tabs for 2 days, then 1 tab by mouth daily for 2 days, Disp: 42 tablet, Rfl: 0 ?  tiZANidine (ZANAFLEX) 2 MG tablet, Take 1 tablet (2 mg total) by mouth every 8 (eight) hours as needed for muscle spasms., Disp: 15 tablet, Rfl: 0  ? ?Medications ordered in this encounter:  ?Meds ordered this encounter  ?Medications  ? azithromycin  (ZITHROMAX) 500 MG tablet  ?  Sig: Take 1 tablet (500 mg total) by mouth daily for 3 days.  ?  Dispense:  3 tablet  ?  Refill:  0  ?  Order Specific Question:   Supervising Provider  ?  Answer:   Eber Hong [[id number]]  ? guaiFENesin (MUCINEX) 600 MG 12 hr tablet  ?  Sig: Take 1 tablet (600 mg total) by mouth 2 (two) times daily.  ?  Dispense:  6 tablet  ?  Refill:  0  ?  Order Specific Question:   Supervising Provider  ?  Answer:   Eber Hong [[id number]]  ?  ? ?*If you need refills on other medications prior to your next appointment, please contact your pharmacy* ? ?Follow-Up: ?Call back or seek an in-person evaluation if the symptoms worsen or if the condition fails to improve as anticipated. ? ?Other Instructions ?You were given a prescription for antibiotics. Please take the antibiotic prescription fully.  ? ?Follow up with your regular doctor in 1 week for reassessment and seek care sooner if your symptoms worsen or fail to improve. ? ? ? ?If you have been instructed to have an in-person evaluation today at a local Urgent Care facility, please use the link below. It will take you to a list of all of our available [facility]  Urgent Cares, including address, phone number and hours of operation. Please do not delay care.  ?[place] [facility] C[facility] ? ?If you or a family member do not have a primary care provider, use the link below to schedule a visit and establish care. When you choose a [facility] primary care physician or advanced practice provider, you gain a long-term partner in health. ?Find a Primary Care Provider ? ?Learn more about [facility]'s in-office and virtual care options: ?[place] - Get Care Now ? ?

## 2021-08-30 ENCOUNTER — Telehealth: Payer: Self-pay | Admitting: Physician Assistant

## 2021-08-30 NOTE — Progress Notes (Signed)
The patient no-showed for appointment despite this provider sending direct link, reaching out via phone with no response and waiting for at least 10 minutes from appointment time for patient to join. They will be marked as a NS for this appointment/time.  ? ?Christop Hippert Cody Romy Ipock, PA-C ? ? ? ?

## 2021-08-31 ENCOUNTER — Encounter: Payer: Self-pay | Admitting: Urgent Care

## 2021-08-31 ENCOUNTER — Telehealth: Payer: Self-pay | Admitting: Urgent Care

## 2021-08-31 DIAGNOSIS — J329 Chronic sinusitis, unspecified: Secondary | ICD-10-CM

## 2021-08-31 MED ORDER — LEVOFLOXACIN 500 MG PO TABS: 500.0000 mg | ORAL_TABLET | Freq: Every day | ORAL | 0 refills | Status: AC

## 2021-08-31 NOTE — Patient Instructions (Signed)
?  Ann Simon, thank you for joining [name], PA for today's virtual visit.  While this provider is not your primary care provider (PCP), if your PCP is located in our provider database this encounter information will be shared with them immediately following your visit. ? ?Consent: ?(Patient) Ann Simon provided verbal consent for this virtual visit at the beginning of the encounter. ? ?Current Medications: ? ?Current Outpatient Medications:  ?  levofloxacin (LEVAQUIN) 500 MG tablet, Take 1 tablet (500 mg total) by mouth daily for 7 days., Disp: 7 tablet, Rfl: 0 ?  acetaminophen (TYLENOL) 325 MG tablet, Take 650 mg by mouth every 6 (six) hours as needed., Disp: , Rfl:  ?  acetic acid 2 % otic solution, Place 4 drops into the right ear 3 (three) times daily., Disp: 15 mL, Rfl: 0 ?  Diclofenac Sodium (PENNSAID) 2 % SOLN, Apply 2 g topically 2 (two) times daily as needed (to affected area)., Disp: 112 g, Rfl: 3 ?  fluticasone (FLONASE) 50 MCG/ACT nasal spray, Place into both nostrils., Disp: , Rfl:  ?  guaiFENesin (MUCINEX) 600 MG 12 hr tablet, Take 1 tablet (600 mg total) by mouth 2 (two) times daily., Disp: 6 tablet, Rfl: 0 ?  loratadine (CLARITIN) 10 MG tablet, Take 10 mg by mouth daily., Disp: , Rfl:  ?  tiZANidine (ZANAFLEX) 2 MG tablet, Take 1 tablet (2 mg total) by mouth every 8 (eight) hours as needed for muscle spasms., Disp: 15 tablet, Rfl: 0  ? ?Medications ordered in this encounter:  ?Meds ordered this encounter  ?Medications  ? levofloxacin (LEVAQUIN) 500 MG tablet  ?  Sig: Take 1 tablet (500 mg total) by mouth daily for 7 days.  ?  Dispense:  7 tablet  ?  Refill:  0  ?  Order Specific Question:   Supervising Provider  ?  Answer:   Eber Hong [[id number]]  ?  ? ?*If you need refills on other medications prior to your next appointment, please contact your pharmacy* ? ?Follow-Up: ?Call back or seek an in-person evaluation if the symptoms worsen or if the condition fails to improve as  anticipated. ? ?Other Instructions ?Your symptoms are consistent with chronic, recurrent sinusitis. ?This can be exacerbated by seasonal allergies. ?Please continue your antihistamines (Claritin) and nasal sprays (Flonase and neti pot) recommended by your ENT. ?Maintain adequate hydration as dehydration can worsen sinus pain. ?Start taking your levaquin once daily, every 24 hours. Avoid excessive sunlight. Monitor for any severe ankle pain, if this occurs stop the medication and contact us immediately. ?Please contact your PCP or ENT should symptoms persist or worsen. ? ? ?If you have been instructed to have an in-person evaluation today at a local Urgent Care facility, please use the link below. It will take you to a list of all of our available [facility] Urgent Cares, including address, phone number and hours of operation. Please do not delay care.  ?[facility] ? ?If you or a family member do not have a primary care provider, use the link below to schedule a visit and establish care. When you choose a [facility] primary care physician or advanced practice provider, you gain a long-term partner in health. ?Find a Primary Care Provider ? ?Learn more about [facility]'s in-office and virtual care options: ?[facility] - Get Care Now  ?

## 2021-08-31 NOTE — Progress Notes (Signed)
?Virtual Visit Consent  ? ?Ann Simon, you are scheduled for a virtual visit with a [facility] provider today.   ?  ?Just as with appointments in the office, your consent must be obtained to participate.  Your consent will be active for this visit and any virtual visit you may have with one of our providers in the next 365 days.   ?  ?If you have a MyChart account, a copy of this consent can be sent to you electronically.  All virtual visits are billed to your insurance company just like a traditional visit in the office.   ? ?As this is a virtual visit, video technology does not allow for your provider to perform a traditional examination.  This may limit your provider's ability to fully assess your condition.  If your provider identifies any concerns that need to be evaluated in person or the need to arrange testing (such as labs, EKG, etc.), we will make arrangements to do so.   ?  ?Although advances in technology are sophisticated, we cannot ensure that it will always work on either your end or our end.  If the connection with a video visit is poor, the visit may have to be switched to a telephone visit.  With either a video or telephone visit, we are not always able to ensure that we have a secure connection.    ? ?I need to obtain your verbal consent now.   Are you willing to proceed with your visit today?  ?  ?Ann Simon has provided verbal consent on 08/31/2021 for a virtual visit (video or telephone). ?  ?Shireen Rayburn L Treina Arscott, PA  ? ?Date: 08/31/2021 4:32 PM ? ? ?Virtual Visit via Video Note  ? ?I, [place], connected with  Ann Simon  ([id number], [birth year]) on 08/31/21 at  4:15 PM EDT by a video-enabled telemedicine application and verified that I am speaking with the correct person using two identifiers. ? ?Location: ?Patient: Virtual Visit Location Patient: Home ?Provider: Virtual Visit Location Provider: Home ?  ?I discussed the limitations of evaluation and management by telemedicine and the  availability of in person appointments. The patient expressed understanding and agreed to proceed.   ? ?History of Present Illness: ?Ann Simon is a 31 y.o. who identifies as a female who was assigned female at birth, and is being seen today for recurrent sinusitis. She reports a longstanding hx of chronic sinusitis, has had several sinus surgeries in the past. Reports her last surgery was a year ago. She also has a hx of allergies and reports seasonal sx in the spring annually. She takes claritin daily, in addition to saline rinses (neti pot), and flonase. Additionally she uses a humidifier at night. Pt was seen via video visit on 08/26/21 and prescribed 3 tabs of 500mg  Azithromycin. Pt took all the medications as prescribed and states she is still having severe nasal congestion and bilateral maxillary sinus pain. She reports a "hx of needing two courses of antibiotics to get rid of it." She had contacted her PCP in which 10days of Augmentin was called in, however pt did not take nor fill this prescription. She denies severe headache, fever, post nasal drainage, sore throat, or any lower respiratory tract symptoms. ? ?HPI: HPI  ?Problems:  ?Patient Active Problem List  ? Diagnosis Date Noted  ? Acute sinusitis 08/26/2021  ? Primary osteoarthritis of left knee 04/22/2021  ?  ?Allergies:  ?Allergies  ?Allergen Reactions  ? Varicella Virus Vaccine Live  Nausea And Vomiting  ?  Pt got ill after immunization ?Pt got ill after immunization ?  ? ?Medications:  ?Current Outpatient Medications:  ?  levofloxacin (LEVAQUIN) 500 MG tablet, Take 1 tablet (500 mg total) by mouth daily for 7 days., Disp: 7 tablet, Rfl: 0 ?  acetaminophen (TYLENOL) 325 MG tablet, Take 650 mg by mouth every 6 (six) hours as needed., Disp: , Rfl:  ?  acetic acid 2 % otic solution, Place 4 drops into the right ear 3 (three) times daily., Disp: 15 mL, Rfl: 0 ?  Diclofenac Sodium (PENNSAID) 2 % SOLN, Apply 2 g topically 2 (two) times daily as needed  (to affected area)., Disp: 112 g, Rfl: 3 ?  fluticasone (FLONASE) 50 MCG/ACT nasal spray, Place into both nostrils., Disp: , Rfl:  ?  guaiFENesin (MUCINEX) 600 MG 12 hr tablet, Take 1 tablet (600 mg total) by mouth 2 (two) times daily., Disp: 6 tablet, Rfl: 0 ?  loratadine (CLARITIN) 10 MG tablet, Take 10 mg by mouth daily., Disp: , Rfl:  ?  tiZANidine (ZANAFLEX) 2 MG tablet, Take 1 tablet (2 mg total) by mouth every 8 (eight) hours as needed for muscle spasms., Disp: 15 tablet, Rfl: 0 ? ?Observations/Objective: ?Patient is well-developed, well-nourished in no acute distress.  ?Resting comfortably on couch at home.  ?Head is normocephalic, atraumatic.  ?No labored breathing. No coughing on exam. ?Speech is clear and coherent with logical content.  ?Patient is alert and oriented at baseline.  ?Patient performed palpation of sinuses revealing tenderness to bilateral maxillary sinuses and nasal bridge. ? ?Assessment and Plan: ?1. Recurrent sinusitis ? ? ?Pt with a longstanding hx of recurrent sinus issues. Admits her sx are consistent with all prior infections, no red flag s/sx today. Did not get improvement or relief on the 500mg  azithromycin alone, is requesting additional abx due to efficacy of this in the past. Discussed BBW of fluoroquinolones with pt, all questions/ concerns addressed. Pt will continue all her prior Rx medications from ENT. Encouraged adequate hydration to ensure resolution of sinus symptoms. ? ?Follow Up Instructions: ?I discussed the assessment and treatment plan with the patient. The patient was provided an opportunity to ask questions and all were answered. The patient agreed with the plan and demonstrated an understanding of the instructions.  A copy of instructions were sent to the patient via MyChart unless otherwise noted below.  ? ? ?The patient was advised to call back or seek an in-person evaluation if the symptoms worsen or if the condition fails to improve as anticipated. ? ?Time:   ?I spent 10 minutes with the patient via telehealth technology discussing the above problems/concerns.   ? ?Alanya Vukelich L Marsela Kuan, PA  ?

## 2021-09-19 ENCOUNTER — Ambulatory Visit: Payer: Self-pay

## 2021-09-28 ENCOUNTER — Telehealth: Payer: Self-pay | Admitting: Physician Assistant

## 2021-09-28 NOTE — Progress Notes (Signed)
The patient no-showed for appointment despite this provider sending direct link x 2 with no response and waiting for at least 10 minutes from appointment time for patient to join. They will be marked as a NS for this appointment/time.   Jalexia Lalli M Dewey Viens, PA-C    

## 2021-09-30 ENCOUNTER — Other Ambulatory Visit: Payer: Self-pay | Admitting: Orthopaedic Surgery

## 2021-09-30 MED ORDER — AZITHROMYCIN 250 MG PO TABS: ORAL_TABLET | ORAL | 0 refills | Status: DC

## 2021-10-19 ENCOUNTER — Ambulatory Visit (HOSPITAL_BASED_OUTPATIENT_CLINIC_OR_DEPARTMENT_OTHER)
Admission: RE | Admit: 2021-10-19 | Discharge: 2021-10-19 | Disposition: A | Discharge status: Home / Self Care | Payer: 59 | Source: Ambulatory Visit | Attending: Urgent Care | Admitting: Urgent Care

## 2021-10-19 ENCOUNTER — Ambulatory Visit
Admission: RE | Admit: 2021-10-19 | Discharge: 2021-10-19 | Disposition: A | Discharge status: Home / Self Care | Payer: 59 | Source: Ambulatory Visit | Attending: Urgent Care | Admitting: Urgent Care

## 2021-10-19 VITALS — BP 102/68 | HR 70 | Temp 98.9°F | Resp 19

## 2021-10-19 DIAGNOSIS — M25521 Pain in right elbow: Secondary | ICD-10-CM | POA: Diagnosis present

## 2021-10-19 DIAGNOSIS — S5001XA Contusion of right elbow, initial encounter: Secondary | ICD-10-CM | POA: Diagnosis not present

## 2021-10-19 MED ORDER — NAPROXEN 375 MG PO TABS: 375.0000 mg | ORAL_TABLET | Freq: Two times a day (BID) | ORAL | 0 refills | Status: DC

## 2021-10-19 NOTE — ED Triage Notes (Signed)
Pt presents with complaints of right elbow pain after hitting it on the side rail on her porch yesterday. Relief with tylenol. ?

## 2021-10-19 NOTE — ED Provider Notes (Signed)
?[facility] ? ? ?MRN: [id number] DOB: August 24, 1990 ? ?Subjective:  ? ?Tersea Mosher is a 31 y.o. female presenting for 1 day history of suffering a right elbow injury.  Patient accidentally fell into the railing and hit the right lateral aspect of her elbow.  She felt immediate moderate to severe pain with numbness and tingling.  Wanted to make sure she got evaluated today for her symptoms.  No bruising.  No open wounds. ? ?No current facility-administered medications for this encounter. ? ?Current Outpatient Medications:  ?  acetaminophen (TYLENOL) 325 MG tablet, Take 650 mg by mouth every 6 (six) hours as needed., Disp: , Rfl:  ?  acetic acid 2 % otic solution, Place 4 drops into the right ear 3 (three) times daily., Disp: 15 mL, Rfl: 0 ?  azithromycin (ZITHROMAX Z-PAK) 250 MG tablet, Take as directed, Disp: 6 each, Rfl: 0 ?  Diclofenac Sodium (PENNSAID) 2 % SOLN, Apply 2 g topically 2 (two) times daily as needed (to affected area)., Disp: 112 g, Rfl: 3 ?  fluticasone (FLONASE) 50 MCG/ACT nasal spray, Place into both nostrils., Disp: , Rfl:  ?  guaiFENesin (MUCINEX) 600 MG 12 hr tablet, Take 1 tablet (600 mg total) by mouth 2 (two) times daily., Disp: 6 tablet, Rfl: 0 ?  loratadine (CLARITIN) 10 MG tablet, Take 10 mg by mouth daily., Disp: , Rfl:  ?  tiZANidine (ZANAFLEX) 2 MG tablet, Take 1 tablet (2 mg total) by mouth every 8 (eight) hours as needed for muscle spasms., Disp: 15 tablet, Rfl: 0  ? ?Allergies  ?Allergen Reactions  ? Varicella Virus Vaccine Live Nausea And Vomiting  ?  Pt got ill after immunization ?Pt got ill after immunization ?  ? ? ?History reviewed. No pertinent past medical history.  ? ?Past Surgical History:  ?Procedure Laterality Date  ? ANTERIOR CRUCIATE LIGAMENT REPAIR Bilateral   ? CESAREAN SECTION    ? CHOLECYSTECTOMY    ? NASAL SINUS SURGERY    ? ? ?Family History  ?Problem Relation Age of Onset  ? Healthy Mother   ? Healthy Father   ? Hypertension Mother    ? ? ?Social History  ? ?Tobacco Use  ? Smoking status: Never  ? Smokeless tobacco: Never  ?Vaping Use  ? Vaping Use: Never used  ?Substance Use Topics  ? Alcohol use: Not Currently  ? Drug use: Never  ? ? ?ROS ? ? ?Objective:  ? ?Vitals: ?BP 102/68   Pulse 70   Temp 98.9 ?F (37.2 ?C)   Resp 19   SpO2 97%  ? ?Physical Exam ?Constitutional:   ?   General: She is not in acute distress. ?   Appearance: Normal appearance. She is well-developed. She is not ill-appearing, toxic-appearing or diaphoretic.  ?HENT:  ?   Head: Normocephalic and atraumatic.  ?   Nose: Nose normal.  ?   Mouth/Throat:  ?   Mouth: Mucous membranes are moist.  ?Eyes:  ?   General: No scleral icterus.    ?   Right eye: No discharge.     ?   Left eye: No discharge.  ?   Extraocular Movements: Extraocular movements intact.  ?Cardiovascular:  ?   Rate and Rhythm: Normal rate.  ?Pulmonary:  ?   Effort: Pulmonary effort is normal.  ?Musculoskeletal:  ?   Right elbow: No swelling, deformity, effusion or lacerations. Normal range of motion. Tenderness present in lateral epicondyle and olecranon process. No radial head  or medial epicondyle tenderness.  ?Skin: ?   General: Skin is warm and dry.  ?Neurological:  ?   General: No focal deficit present.  ?   Mental Status: She is alert and oriented to person, place, and time.  ?Psychiatric:     ?   Mood and Affect: Mood normal.     ?   Behavior: Behavior normal.  ? ? ?DG Elbow Complete Right ? ?Result Date: 10/19/2021 ?CLINICAL DATA:  right elbow pain EXAM: RIGHT ELBOW - COMPLETE 3+ VIEW COMPARISON:  None Available. FINDINGS: Normal alignment. No acute fracture. Normal mineralization. The soft tissues are unremarkable. No joint effusion. IMPRESSION: No acute osseous abnormality. Electronically Signed   By: Albin Felling M.D.   On: 10/19/2021 16:09   ? ?A 3 inch Ace wrap was applied to the right elbow. ? ?Assessment and Plan :  ? ?PDMP not reviewed this encounter. ? ?1. Contusion of right elbow, initial  encounter   ?2. Right elbow pain   ? ?Recommended conservative management for a right elbow contusion.  Use RICE method, naproxen for pain and inflammation. Counseled patient on potential for adverse effects with medications prescribed/recommended today, ER and return-to-clinic precautions discussed, patient verbalized understanding. ? ?  ?Jaynee Eagles, PA-C ?10/19/21 1614 ? ?

## 2021-10-25 ENCOUNTER — Ambulatory Visit
Admission: RE | Admit: 2021-10-25 | Discharge: 2021-10-25 | Disposition: A | Discharge status: Home / Self Care | Payer: 59 | Source: Ambulatory Visit | Attending: Urgent Care | Admitting: Urgent Care

## 2021-10-25 VITALS — BP 108/71 | HR 69 | Temp 98.0°F | Resp 18

## 2021-10-25 DIAGNOSIS — S39011A Strain of muscle, fascia and tendon of abdomen, initial encounter: Secondary | ICD-10-CM

## 2021-10-25 MED ORDER — CYCLOBENZAPRINE HCL 5 MG PO TABS: 5.0000 mg | ORAL_TABLET | Freq: Every evening | ORAL | 0 refills | Status: DC | PRN

## 2021-10-25 NOTE — ED Provider Notes (Signed)
[facility]   MRN: [id number] DOB: 1990-10-04  Subjective:   Ann Simon is a 31 y.o. female presenting for 1 day history of suffering an injury of the right lower abdomen.  Patient has been moving and has injured herself once already.  Today she was lifting boxes and carrying them and during 1 particular load she ended up dropping it and as she tried to lift it back up to do too quickly and then felt sharp pain over the right lower abdomen/pelvic area.  She noticed a knot close to the area where she had her C-section.  She is concerned that this may be a hernia and wants to be evaluated.  No current facility-administered medications for this encounter.  Current Outpatient Medications:    acetaminophen (TYLENOL) 325 MG tablet, Take 650 mg by mouth every 6 (six) hours as needed., Disp: , Rfl:    acetic acid 2 % otic solution, Place 4 drops into the right ear 3 (three) times daily., Disp: 15 mL, Rfl: 0   azithromycin (ZITHROMAX Z-PAK) 250 MG tablet, Take as directed, Disp: 6 each, Rfl: 0   Diclofenac Sodium (PENNSAID) 2 % SOLN, Apply 2 g topically 2 (two) times daily as needed (to affected area)., Disp: 112 g, Rfl: 3   fluticasone (FLONASE) 50 MCG/ACT nasal spray, Place into both nostrils., Disp: , Rfl:    guaiFENesin (MUCINEX) 600 MG 12 hr tablet, Take 1 tablet (600 mg total) by mouth 2 (two) times daily., Disp: 6 tablet, Rfl: 0   loratadine (CLARITIN) 10 MG tablet, Take 10 mg by mouth daily., Disp: , Rfl:    naproxen (NAPROSYN) 375 MG tablet, Take 1 tablet (375 mg total) by mouth 2 (two) times daily with a meal., Disp: 30 tablet, Rfl: 0   tiZANidine (ZANAFLEX) 2 MG tablet, Take 1 tablet (2 mg total) by mouth every 8 (eight) hours as needed for muscle spasms., Disp: 15 tablet, Rfl: 0   Allergies  Allergen Reactions   Varicella Virus Vaccine Live Nausea And Vomiting    Pt got ill after immunization Pt got ill after immunization     History reviewed. No pertinent  past medical history.   Past Surgical History:  Procedure Laterality Date   ANTERIOR CRUCIATE LIGAMENT REPAIR Bilateral    CESAREAN SECTION     CHOLECYSTECTOMY     NASAL SINUS SURGERY      Family History  Problem Relation Age of Onset   Healthy Mother    Healthy Father    Hypertension Mother     Social History   Tobacco Use   Smoking status: Never   Smokeless tobacco: Never  Vaping Use   Vaping Use: Never used  Substance Use Topics   Alcohol use: Not Currently   Drug use: Never    ROS   Objective:   Vitals: BP 108/71 (BP Location: Left Arm)   Pulse 69   Temp 98 F (36.7 C) (Oral)   Resp 18   SpO2 98%   Physical Exam Constitutional:      General: She is not in acute distress.    Appearance: Normal appearance. She is well-developed. She is not ill-appearing, toxic-appearing or diaphoretic.  HENT:     Head: Normocephalic and atraumatic.     Nose: Nose normal.     Mouth/Throat:     Mouth: Mucous membranes are moist.     Pharynx: Oropharynx is clear.  Eyes:     General: No scleral icterus.  Right eye: No discharge.        Left eye: No discharge.     Extraocular Movements: Extraocular movements intact.     Conjunctiva/sclera: Conjunctivae normal.  Cardiovascular:     Rate and Rhythm: Normal rate.  Pulmonary:     Effort: Pulmonary effort is normal.  Abdominal:     General: Bowel sounds are normal. There is no distension.     Palpations: Abdomen is soft. There is no mass.     Tenderness: There is no abdominal tenderness. There is no right CVA tenderness, left CVA tenderness, guarding or rebound.     Hernia: No hernia is present. There is no hernia in the umbilical area, ventral area, left inguinal area, right femoral area, left femoral area or right inguinal area.    Skin:    General: Skin is warm and dry.  Neurological:     General: No focal deficit present.     Mental Status: She is alert and oriented to person, place, and time.  Psychiatric:         Mood and Affect: Mood normal.        Behavior: Behavior normal.        Thought Content: Thought content normal.        Judgment: Judgment normal.    Assessment and Plan :   PDMP not reviewed this encounter.  1. Abdominal wall strain, initial encounter    Will manage conservatively for abdominal wall strain with NSAID and muscle relaxant, rest and modification of physical activity.  Anticipatory guidance provided.  Counseled patient on potential for adverse effects with medications prescribed/recommended today, ER and return-to-clinic precautions discussed, patient verbalized understanding.     Jaynee Eagles, PA-C 10/25/21 1525

## 2021-10-25 NOTE — ED Triage Notes (Signed)
Pt states while moving boxes she began having pain to her RLQ pain.   Started: today

## 2021-10-28 ENCOUNTER — Other Ambulatory Visit: Payer: Self-pay | Admitting: Orthopaedic Surgery

## 2021-10-29 ENCOUNTER — Telehealth: Payer: 59 | Admitting: Physician Assistant

## 2021-10-29 DIAGNOSIS — J019 Acute sinusitis, unspecified: Secondary | ICD-10-CM

## 2021-10-29 MED ORDER — AZITHROMYCIN 250 MG PO TABS: ORAL_TABLET | ORAL | 0 refills | Status: AC

## 2021-10-29 MED ORDER — AZITHROMYCIN 250 MG PO TABS: ORAL_TABLET | ORAL | 0 refills | Status: DC

## 2021-10-29 NOTE — Progress Notes (Signed)
E-Visit for Sinus Problems  We are sorry that you are not feeling well.  Here is how we plan to help!  Based on what you have shared with me it looks like you have sinusitis.  Sinusitis is inflammation and infection in the sinus cavities of the head.  Based on your presentation I believe you most likely have Acute Bacterial Sinusitis.  This is an infection caused by bacteria and is treated with antibiotics. I have prescribed azithromycin antibiotic (z-pak.)    You may use an oral decongestant such as Mucinex D or if you have glaucoma or high blood pressure use plain Mucinex. Saline nasal spray help and can safely be used as often as needed for congestion.  If you develop worsening sinus pain, fever or notice severe headache and vision changes, or if symptoms are not better after completion of antibiotic, please schedule an appointment with a health care provider.    Sinus infections are not as easily transmitted as other respiratory infection, however we still recommend that you avoid close contact with loved ones, especially the very young and elderly.  Remember to wash your hands thoroughly throughout the day as this is the number one way to prevent the spread of infection!  Home Care: Only take medications as instructed by your medical team. Complete the entire course of an antibiotic. Do not take these medications with alcohol. A steam or ultrasonic humidifier can help congestion.  You can place a towel over your head and breathe in the steam from hot water coming from a faucet. Avoid close contacts especially the very young and the elderly. Cover your mouth when you cough or sneeze. Always remember to wash your hands.  Get Help Right Away If: You develop worsening fever or sinus pain. You develop a severe head ache or visual changes. Your symptoms persist after you have completed your treatment plan.  Make sure you Understand these instructions. Will watch your condition. Will get  help right away if you are not doing well or get worse.  Thank you for choosing an e-visit.  Your e-visit answers were reviewed by a board certified advanced clinical practitioner to complete your personal care plan. Depending upon the condition, your plan could have included both over the counter or prescription medications.  Please review your pharmacy choice. Make sure the pharmacy is open so you can pick up prescription now. If there is a problem, you may contact your provider through [employer] and have the prescription routed to another pharmacy.  Your safety is important to [place]. If you have drug allergies check your prescription carefully.   For the next 24 hours you can use MyChart to ask questions about today's visit, request a non-urgent call back, or ask for a work or school excuse. You will get an email in the next two days asking about your experience. I hope that your e-visit has been valuable and will speed your recovery.  This appointment required 5-10 minutes of patient care (this includes precharting, chart review, review of results, face-to-face care, etc.).  Jarold Motto PA-C

## 2021-10-29 NOTE — Addendum Note (Signed)
Addended by: Haynes Bast on: 10/29/2021 06:00 PM   Modules accepted: Orders

## 2022-01-10 ENCOUNTER — Telehealth: Payer: 59 | Admitting: Physician Assistant

## 2022-01-10 DIAGNOSIS — J019 Acute sinusitis, unspecified: Secondary | ICD-10-CM | POA: Diagnosis not present

## 2022-01-10 DIAGNOSIS — B9689 Other specified bacterial agents as the cause of diseases classified elsewhere: Secondary | ICD-10-CM

## 2022-01-10 MED ORDER — AZITHROMYCIN 250 MG PO TABS: ORAL_TABLET | ORAL | 0 refills | Status: AC

## 2022-01-10 MED ORDER — AMOXICILLIN-POT CLAVULANATE 875-125 MG PO TABS: 1.0000 | ORAL_TABLET | Freq: Two times a day (BID) | ORAL | 0 refills | Status: DC

## 2022-01-10 NOTE — Progress Notes (Signed)

## 2022-01-10 NOTE — Progress Notes (Signed)
I have spent 5 minutes in review of e-visit questionnaire, review and updating patient chart, medical decision making and response to patient.   Taleah Bellantoni Cody Linda Biehn, PA-C    

## 2022-01-10 NOTE — Addendum Note (Signed)
Addended by: Waldon Merl on: 01/10/2022 06:46 AM   Modules accepted: Orders

## 2022-02-23 ENCOUNTER — Telehealth: Payer: 59 | Admitting: Physician Assistant

## 2022-02-23 DIAGNOSIS — J019 Acute sinusitis, unspecified: Secondary | ICD-10-CM

## 2022-02-23 DIAGNOSIS — B9689 Other specified bacterial agents as the cause of diseases classified elsewhere: Secondary | ICD-10-CM

## 2022-02-23 MED ORDER — AMOXICILLIN-POT CLAVULANATE 875-125 MG PO TABS: 1.0000 | ORAL_TABLET | Freq: Two times a day (BID) | ORAL | 0 refills | Status: DC

## 2022-02-23 NOTE — Progress Notes (Signed)

## 2022-02-23 NOTE — Progress Notes (Signed)
I have spent 5 minutes in review of e-visit questionnaire, review and updating patient chart, medical decision making and response to patient.   Bobbyjoe Pabst Cody Ellen Mayol, PA-C    

## 2022-02-25 ENCOUNTER — Telehealth: Payer: Self-pay | Admitting: Nurse Practitioner

## 2022-02-25 ENCOUNTER — Telehealth: Payer: 59 | Admitting: Nurse Practitioner

## 2022-02-25 ENCOUNTER — Telehealth: Payer: 59

## 2022-02-25 DIAGNOSIS — H5789 Other specified disorders of eye and adnexa: Secondary | ICD-10-CM

## 2022-02-25 DIAGNOSIS — R112 Nausea with vomiting, unspecified: Secondary | ICD-10-CM

## 2022-02-25 MED ORDER — ONDANSETRON HCL 4 MG PO TABS: 4.0000 mg | ORAL_TABLET | Freq: Three times a day (TID) | ORAL | 0 refills | Status: DC | PRN

## 2022-02-25 MED ORDER — POLYMYXIN B-TRIMETHOPRIM 10000-0.1 UNIT/ML-% OP SOLN: 2.0000 [drp] | OPHTHALMIC | 0 refills | Status: DC

## 2022-02-25 NOTE — Progress Notes (Signed)

## 2022-02-25 NOTE — Progress Notes (Signed)
E-Visit for Nausea and Vomiting   We are sorry that you are not feeling well. Here is how we plan to help!  Based on what you have shared with me it looks like you have a Virus that is irritating your GI tract.  Vomiting is the forceful emptying of a portion of the stomach's content through the mouth.  Although nausea and vomiting can make you feel miserable, it's important to remember that these are not diseases, but rather symptoms of an underlying illness.  When we treat short term symptoms, we always caution that any symptoms that persist should be fully evaluated in a medical office.  I have prescribed a medication that will help alleviate your symptoms and allow you to stay hydrated:  Zofran 4 mg 1 tablet every 8 hours as needed for nausea and vomiting  HOME CARE: Drink clear liquids.  This is very important! Dehydration (the lack of fluid) can lead to a serious complication.  Start off with 1 tablespoon every 5 minutes for 8 hours. You may begin eating bland foods after 8 hours without vomiting.  Start with saltine crackers, white bread, rice, mashed potatoes, applesauce. After 48 hours on a bland diet, you may resume a normal diet. Try to go to sleep.  Sleep often empties the stomach and relieves the need to vomit.  GET HELP RIGHT AWAY IF:  Your symptoms do not improve or worsen within 2 days after treatment. You have a fever for over 3 days. You cannot keep down fluids after trying the medication.  MAKE SURE YOU:  Understand these instructions. Will watch your condition. Will get help right away if you are not doing well or get worse.    Thank you for choosing an e-visit.  Your e-visit answers were reviewed by a board certified advanced clinical practitioner to complete your personal care plan. Depending upon the condition, your plan could have included both over the counter or prescription medications.  Please review your pharmacy choice. Make sure the pharmacy is open so  you can pick up prescription now. If there is a problem, you may contact your provider through MyChart messaging and have the prescription routed to another pharmacy.  Your safety is important to us. If you have drug allergies check your prescription carefully.   For the next 24 hours you can use MyChart to ask questions about today's visit, request a non-urgent call back, or ask for a work or school excuse. You will get an email in the next two days asking about your experience. I hope that your e-visit has been valuable and will speed your recovery. Mary-Margaret Rondo Spittler, FNP   5-10 minutes spent reviewing and documenting in chart.  

## 2022-02-25 NOTE — Progress Notes (Signed)
Repeat encounter

## 2022-02-25 NOTE — Progress Notes (Signed)
Repeat evisit

## 2022-03-16 ENCOUNTER — Telehealth: Payer: Self-pay | Admitting: Physician Assistant

## 2022-03-16 DIAGNOSIS — S39012A Strain of muscle, fascia and tendon of lower back, initial encounter: Secondary | ICD-10-CM

## 2022-03-17 NOTE — Progress Notes (Signed)
Patient completed E-visit with [facility] yesterday already

## 2022-04-10 ENCOUNTER — Telehealth: Payer: Self-pay | Admitting: Physician Assistant

## 2022-04-10 DIAGNOSIS — G43809 Other migraine, not intractable, without status migrainosus: Secondary | ICD-10-CM

## 2022-04-10 DIAGNOSIS — H938X3 Other specified disorders of ear, bilateral: Secondary | ICD-10-CM

## 2022-04-10 MED ORDER — FLUTICASONE PROPIONATE 50 MCG/ACT NA SUSP: 2.0000 | Freq: Every day | NASAL | 0 refills | Status: AC

## 2022-04-10 NOTE — Progress Notes (Signed)
E visit for Eustachian Tube Dysfunction We are sorry that you are not feeling well.  Here is how we plan to help!  I also would recommend a nasal spray: Flonase 2 sprays into each nostril once daily  HOME CARE:  You can use an over-the-counter saline nasal spray as needed Avoid areas where there is heavy dust, mites, or molds Stay indoors on windy days during the pollen season Keep windows closed in home, at least in bedroom; use air conditioner. Use high-efficiency house air filter Keep windows closed in car, turn AC on re-circulate Avoid playing out with dog during pollen season  GET HELP RIGHT AWAY IF:  If your symptoms do not improve within 10 days You become short of breath You develop yellow or green discharge from your nose for over 3 days You have coughing fits  MAKE SURE YOU:  Understand these instructions Will watch your condition Will get help right away if you are not doing well or get worse  Thank you for choosing an e-visit. Your e-visit answers were reviewed by a board certified advanced clinical practitioner to complete your personal care plan. Depending upon the condition, your plan could have included both over the counter or prescription medications. Please review your pharmacy choice. Be sure that the pharmacy you have chosen is open so that you can pick up your prescription now.  If there is a problem you may message your provider in [place] to have the prescription routed to another pharmacy. Your safety is important to [place]. If you have drug allergies check your prescription carefully.  For the next 24 hours, you can use MyChart to ask questions about today's visit, request a non-urgent call back, or ask for a work or school excuse from your e-visit provider. You will get an email in the next two days asking about your experience. I hope that your e-visit has been valuable and will speed your recovery.   I have spent 5 minutes in review of e-visit  questionnaire, review and updating patient chart, medical decision making and response to patient.   Mar Daring, PA-C

## 2022-04-16 ENCOUNTER — Telehealth: Payer: Self-pay | Admitting: Emergency Medicine

## 2022-04-16 DIAGNOSIS — B9789 Other viral agents as the cause of diseases classified elsewhere: Secondary | ICD-10-CM

## 2022-04-16 DIAGNOSIS — J019 Acute sinusitis, unspecified: Secondary | ICD-10-CM

## 2022-04-16 NOTE — Progress Notes (Signed)
E-Visit for Sinus Problems  We are sorry that you are not feeling well.  Here is how we plan to help!  Based on what you have shared with me it looks like you have sinusitis.  Sinusitis is inflammation and infection in the sinus cavities of the head.  Based on your presentation I believe you most likely have Acute Viral Sinusitis.This is an infection most likely caused by a virus. There is not specific treatment for viral sinusitis other than to help you with the symptoms until the infection runs its course.    Antibiotics are not recommended by the Infectious Disease Society of [place] unless you have severe symptoms (including high fever) or you have symptoms for more than 10 days. If you still have symptoms after 10 days, antibiotics should be considered.    You may use an oral decongestant such as Mucinex D or if you have glaucoma or high blood pressure use plain Mucinex.   Saline nasal spray help and can safely be used as often as needed for congestion. Try using saline irrigation, such as with a neti pot, several times a day while you are sick. Many neti pots come with salt packets premeasured to use to make saline. If you use your own salt, make sure it is kosher salt or sea salt (don't use table salt as it has iodine in it and you don't need that in your nose). Use distilled water to make saline. If you mix your own saline using your own salt, the recipe is 1/4 teaspoon salt in 1 cup warm water. Using saline irrigation can help prevent and treat sinus infections.   Please continue to use Flonase nasal spray daily.   Some authorities believe that zinc sprays or the use of Echinacea may shorten the course of your symptoms.  Sinus infections are not as easily transmitted as other respiratory infection, however we still recommend that you avoid close contact with loved ones, especially the very young and elderly.  Remember to wash your hands thoroughly throughout the day as this is the number one  way to prevent the spread of infection!  Home Care: Only take medications as instructed by your medical team. Do not take these medications with alcohol. A steam or ultrasonic humidifier can help congestion.  You can place a towel over your head and breathe in the steam from hot water coming from a faucet. Avoid close contacts especially the very young and the elderly. Cover your mouth when you cough or sneeze. Always remember to wash your hands.  Get Help Right Away If: You develop worsening fever or sinus pain. You develop a severe head ache or visual changes. Your symptoms persist after you have completed your treatment plan.  Make sure you Understand these instructions. Will watch your condition. Will get help right away if you are not doing well or get worse.   Thank you for choosing an e-visit.  Your e-visit answers were reviewed by a board certified advanced clinical practitioner to complete your personal care plan. Depending upon the condition, your plan could have included both over the counter or prescription medications.  Please review your pharmacy choice. Make sure the pharmacy is open so you can pick up prescription now. If there is a problem, you may contact your provider through [employer] and have the prescription routed to another pharmacy.  Your safety is important to [place]. If you have drug allergies check your prescription carefully.   For the next 24 hours  you can use MyChart to ask questions about today's visit, request a non-urgent call back, or ask for a work or school excuse. You will get an email in the next two days asking about your experience. I hope that your e-visit has been valuable and will speed your recovery.  I have spent 5 minutes in review of e-visit questionnaire, review and updating patient chart, medical decision making and response to patient.   Willeen Cass, PhD, FNP-BC

## 2022-05-09 ENCOUNTER — Telehealth: Payer: Self-pay | Admitting: Physician Assistant

## 2022-05-09 DIAGNOSIS — A084 Viral intestinal infection, unspecified: Secondary | ICD-10-CM

## 2022-05-10 MED ORDER — ONDANSETRON HCL 4 MG PO TABS: 4.0000 mg | ORAL_TABLET | Freq: Three times a day (TID) | ORAL | 0 refills | Status: DC | PRN

## 2022-05-10 MED ORDER — PROMETHAZINE HCL 25 MG PO TABS: 25.0000 mg | ORAL_TABLET | Freq: Three times a day (TID) | ORAL | 0 refills | Status: DC | PRN

## 2022-05-10 NOTE — Progress Notes (Signed)
I have spent 5 minutes in review of e-visit questionnaire, review and updating patient chart, medical decision making and response to patient.   Jagdeep Ancheta Cody Avelina Mcclurkin, PA-C    

## 2022-05-10 NOTE — Addendum Note (Signed)
Addended by: Waldon Merl on: 05/10/2022 07:13 AM   Modules accepted: Orders

## 2022-05-10 NOTE — Progress Notes (Signed)

## 2022-05-15 ENCOUNTER — Telehealth: Payer: Self-pay | Admitting: Nurse Practitioner

## 2022-05-15 DIAGNOSIS — J324 Chronic pansinusitis: Secondary | ICD-10-CM

## 2022-05-15 MED ORDER — DOXYCYCLINE HYCLATE 100 MG PO TABS: 100.0000 mg | ORAL_TABLET | Freq: Two times a day (BID) | ORAL | 0 refills | Status: AC

## 2022-05-15 NOTE — Progress Notes (Signed)
E-Visit for Sinus Problems  Ann Simon,  I reviewed your chart. Since we have been treating you for sinus infections almost every month we need to recommend that you are seen by an ENT. Most Ear Nose and Throat (ENT) doctors do not require a referral but some might- If this is the case you will need to go through your primary care doctor.   Please let me know if you have a primary care doctor, or if you need help finding one.   As it will take time to get in with a specialist we can prescribe antibiotics one more time, but please note our recommendation is that after this round you are seen by a specialist and this will likely mean that future recurrent visits for the same issues may be deferred.   Based on what you have shared with me it looks like you have sinusitis.  Sinusitis is inflammation and infection in the sinus cavities of the head.  Based on your presentation I believe you most likely have Acute Bacterial Sinusitis.  This is an infection caused by bacteria and is treated with antibiotics. I have prescribed Doxycycline 100mg  by mouth twice a day for 10 days. You may use an oral decongestant such as Mucinex D or if you have glaucoma or high blood pressure use plain Mucinex. Saline nasal spray help and can safely be used as often as needed for congestion.  If you develop worsening sinus pain, fever or notice severe headache and vision changes, or if symptoms are not better after completion of antibiotic, please schedule an appointment with a health care provider.    Sinus infections are not as easily transmitted as other respiratory infection, however we still recommend that you avoid close contact with loved ones, especially the very young and elderly.  Remember to wash your hands thoroughly throughout the day as this is the number one way to prevent the spread of infection!  Home Care: Only take medications as instructed by your medical team. Complete the entire course of an antibiotic. Do  not take these medications with alcohol. A steam or ultrasonic humidifier can help congestion.  You can place a towel over your head and breathe in the steam from hot water coming from a faucet. Avoid close contacts especially the very young and the elderly. Cover your mouth when you cough or sneeze. Always remember to wash your hands.  Get Help Right Away If: You develop worsening fever or sinus pain. You develop a severe head ache or visual changes. Your symptoms persist after you have completed your treatment plan.  Make sure you Understand these instructions. Will watch your condition. Will get help right away if you are not doing well or get worse.  Thank you for choosing an e-visit.  Your e-visit answers were reviewed by a board certified advanced clinical practitioner to complete your personal care plan. Depending upon the condition, your plan could have included both over the counter or prescription medications.  Please review your pharmacy choice. Make sure the pharmacy is open so you can pick up prescription now. If there is a problem, you may contact your provider through and have the prescription routed to another pharmacy.  Your safety is important to [employer]. If you have drug allergies check your prescription carefully.   For the next 24 hours you can use MyChart to ask questions about today's visit, request a non-urgent call back, or ask for a work or school excuse. You will get an  email in the next two days asking about your experience. I hope that your e-visit has been valuable and will speed your recovery.  Meds ordered this encounter  Medications   doxycycline (VIBRA-TABS) 100 MG tablet    Sig: Take 1 tablet (100 mg total) by mouth 2 (two) times daily for 10 days.    Dispense:  20 tablet    Refill:  0    I spent approximately 7 minutes reviewing the patient's history, current symptoms and coordinating their plan of care today.

## 2022-05-21 ENCOUNTER — Telehealth: Payer: Self-pay | Admitting: Family

## 2022-05-21 DIAGNOSIS — A084 Viral intestinal infection, unspecified: Secondary | ICD-10-CM

## 2022-05-21 MED ORDER — PROMETHAZINE HCL 25 MG PO TABS: 25.0000 mg | ORAL_TABLET | Freq: Three times a day (TID) | ORAL | 0 refills | Status: DC | PRN

## 2022-05-21 NOTE — Progress Notes (Signed)
E-Visit for Vomiting  We are sorry that you are not feeling well. Here is how we plan to help!  Based on what you have shared with me it looks like you have a Virus that is irritating your GI tract.  Vomiting is the forceful emptying of a portion of the stomach's content through the mouth.  Although nausea and vomiting can make you feel miserable, it's important to remember that these are not diseases, but rather symptoms of an underlying illness.  When we treat short term symptoms, we always caution that any symptoms that persist should be fully evaluated in a medical office.  I have prescribed a medication that will help alleviate your symptoms and allow you to stay hydrated:  Promethazine 25 mg take 1 tablet twice daily  HOME CARE: Drink clear liquids.  This is very important! Dehydration (the lack of fluid) can lead to a serious complication.  Start off with 1 tablespoon every 5 minutes for 8 hours. You may begin eating bland foods after 8 hours without vomiting.  Start with saltine crackers, white bread, rice, mashed potatoes, applesauce. After 48 hours on a bland diet, you may resume a normal diet. Try to go to sleep.  Sleep often empties the stomach and relieves the need to vomit.  GET HELP RIGHT AWAY IF:  Your symptoms do not improve or worsen within 2 days after treatment. You have a fever for over 3 days. You cannot keep down fluids after trying the medication.  MAKE SURE YOU:  Understand these instructions. Will watch your condition. Will get help right away if you are not doing well or get worse.   Thank you for choosing an e-visit.  Your e-visit answers were reviewed by a board certified advanced clinical practitioner to complete your personal care plan. Depending upon the condition, your plan could have included both over the counter or prescription medications.  Please review your pharmacy choice. Make sure the pharmacy is open so you can pick up prescription now. If  there is a problem, you may contact your provider through [employer] and have the prescription routed to another pharmacy.  Your safety is important to [place]. If you have drug allergies check your prescription carefully.   For the next 24 hours you can use MyChart to ask questions about today's visit, request a non-urgent call back, or ask for a work or school excuse. You will get an email in the next two days asking about your experience. I hope that your e-visit has been valuable and will speed your recovery.  Approximately 5 minutes was spent documenting and reviewing patient's chart.

## 2022-06-04 ENCOUNTER — Telehealth: Payer: Self-pay | Admitting: Physician Assistant

## 2022-06-04 DIAGNOSIS — R6889 Other general symptoms and signs: Secondary | ICD-10-CM

## 2022-06-04 NOTE — Progress Notes (Signed)
Because you have had multiple virtual visits, 3 in the last month already, without an in-person evaluation, I feel your condition warrants further evaluation and I recommend that you be seen in a face to face visit.   NOTE: There will be NO CHARGE for this eVisit   If you are having a true medical emergency please call 911.      For an urgent face to face visit, [place] has seven urgent care centers for your convenience:     [facility] at [facility] Directions [phone] [address] [place], [place] [address]    [facility]) Get Driving Directions [phone] [address] [place], [place] [address]  [facility]) Get Driving Directions [phone] [address] [place],  [place]  [address]  [facility] at [employer] Directions  [phone] [address] [place],  [place] [address]   [facility] at [facility] Get Driving Directions [phone] [address] [place], [place] [address]   [facility] at [facility] Get Driving Directions  [phone] [address] [place], [place] [address]   [facility] at [facility] Directions [phone] [address] [place], [place] [address]  Your MyChart E-visit questionnaire answers were reviewed by a board certified advanced clinical practitioner to complete your personal care plan based on your specific symptoms.  Thank you for using e-Visits.   I have spent 5 minutes in review of e-visit questionnaire, review and updating patient chart, medical decision making and response to patient.   Margaretann Loveless, PA-C

## 2022-06-30 ENCOUNTER — Telehealth: Payer: Self-pay | Admitting: Emergency Medicine

## 2022-06-30 DIAGNOSIS — J329 Chronic sinusitis, unspecified: Secondary | ICD-10-CM

## 2022-06-30 MED ORDER — AMOXICILLIN-POT CLAVULANATE 875-125 MG PO TABS: 1.0000 | ORAL_TABLET | Freq: Two times a day (BID) | ORAL | 0 refills | Status: DC

## 2022-06-30 MED ORDER — AMOXICILLIN 500 MG PO CAPS: 500.0000 mg | ORAL_CAPSULE | Freq: Three times a day (TID) | ORAL | 0 refills | Status: AC

## 2022-06-30 NOTE — Addendum Note (Signed)
Addended by: Lazaro Arms on: 06/30/2022 08:34 AM   Modules accepted: Orders

## 2022-06-30 NOTE — Progress Notes (Signed)
E-Visit for Sinus Problems  We are sorry that you are not feeling well.  Here is how we plan to help!  Based on what you have shared with me it looks like you have sinusitis.  Sinusitis is inflammation and infection in the sinus cavities of the head.  Based on your presentation I believe you most likely are developing Acute Bacterial Sinusitis.    This could still be viral.  Generally we don't start antibiotics until about day 10, unless you've had severely worsening symptoms.  Since we are going into the weekend, I'll send you and antibiotic, but I strongly recommend not starting it for a few more days.  If this is viral, the antibiotics won't help and you should start to improve in the next couple of days.  If it's turning bacterial, you'll likely worsen, in which case, you should start the antibiotic.  This is an infection caused by bacteria and is treated with antibiotics. I have prescribed Augmentin 875mg /125mg  one tablet twice daily with food, for 7 days. You may use an oral decongestant such as Mucinex D or if you have glaucoma or high blood pressure use plain Mucinex. Saline nasal spray help and can safely be used as often as needed for congestion.  If you develop worsening sinus pain, fever or notice severe headache and vision changes, or if symptoms are not better after completion of antibiotic, please schedule an appointment with a health care provider.    Sinus infections are not as easily transmitted as other respiratory infection, however we still recommend that you avoid close contact with loved ones, especially the very young and elderly.  Remember to wash your hands thoroughly throughout the day as this is the number one way to prevent the spread of infection!  Home Care: Only take medications as instructed by your medical team. Complete the entire course of an antibiotic. Do not take these medications with alcohol. A steam or ultrasonic humidifier can help congestion.  You can place  a towel over your head and breathe in the steam from hot water coming from a faucet. Avoid close contacts especially the very young and the elderly. Cover your mouth when you cough or sneeze. Always remember to wash your hands.  Get Help Right Away If: You develop worsening fever or sinus pain. You develop a severe head ache or visual changes. Your symptoms persist after you have completed your treatment plan.  Make sure you Understand these instructions. Will watch your condition. Will get help right away if you are not doing well or get worse.  Thank you for choosing an e-visit.  Your e-visit answers were reviewed by a board certified advanced clinical practitioner to complete your personal care plan. Depending upon the condition, your plan could have included both over the counter or prescription medications.  Please review your pharmacy choice. Make sure the pharmacy is open so you can pick up prescription now. If there is a problem, you may contact your provider through [employer] and have the prescription routed to another pharmacy.  Your safety is important to [place]. If you have drug allergies check your prescription carefully.   For the next 24 hours you can use MyChart to ask questions about today's visit, request a non-urgent call back, or ask for a work or school excuse. You will get an email in the next two days asking about your experience. I hope that your e-visit has been valuable and will speed your recovery.  Approximately 5 minutes  was used in reviewing the patient's chart, questionnaire, prescribing medications, and documentation.

## 2022-06-30 NOTE — Progress Notes (Signed)
duplicate

## 2022-07-02 ENCOUNTER — Other Ambulatory Visit: Payer: Self-pay | Admitting: Orthopaedic Surgery

## 2022-07-02 MED ORDER — PREDNISONE 50 MG PO TABS: 50.0000 mg | ORAL_TABLET | Freq: Every day | ORAL | 0 refills | Status: AC

## 2022-07-02 MED ORDER — AZITHROMYCIN 250 MG PO TABS: ORAL_TABLET | ORAL | 0 refills | Status: DC

## 2022-07-02 NOTE — Addendum Note (Signed)
Addended by: Geryl Councilman on: 07/02/2022 07:24 AM   Modules accepted: Orders

## 2022-07-09 ENCOUNTER — Other Ambulatory Visit: Payer: Self-pay | Admitting: Orthopaedic Surgery

## 2022-08-05 ENCOUNTER — Telehealth: Payer: Self-pay | Admitting: Nurse Practitioner

## 2022-08-05 DIAGNOSIS — R112 Nausea with vomiting, unspecified: Secondary | ICD-10-CM

## 2022-08-06 MED ORDER — PROMETHAZINE HCL 25 MG PO TABS: 25.0000 mg | ORAL_TABLET | Freq: Two times a day (BID) | ORAL | 0 refills | Status: DC | PRN

## 2022-08-06 NOTE — Progress Notes (Signed)
E-Visit for Nausea and Vomiting   We are sorry that you are not feeling well. Here is how we plan to help!  Based on what you have shared with me it looks like you have a Virus that is irritating your GI tract.  Vomiting is the forceful emptying of a portion of the stomach's content through the mouth.  Although nausea and vomiting can make you feel miserable, it's important to remember that these are not diseases, but rather symptoms of an underlying illness.  When we treat short term symptoms, we always caution that any symptoms that persist should be fully evaluated in a medical office.  I have prescribed a medication that will help alleviate your symptoms and allow you to stay hydrated:  Promethazine 25 mg take 1 tablet twice daily  HOME CARE: Drink clear liquids.  This is very important! Dehydration (the lack of fluid) can lead to a serious complication.  Start off with 1 tablespoon every 5 minutes for 8 hours. You may begin eating bland foods after 8 hours without vomiting.  Start with saltine crackers, white bread, rice, mashed potatoes, applesauce. After 48 hours on a bland diet, you may resume a normal diet. Try to go to sleep.  Sleep often empties the stomach and relieves the need to vomit.  GET HELP RIGHT AWAY IF:  Your symptoms do not improve or worsen within 2 days after treatment. You have a fever for over 3 days. You cannot keep down fluids after trying the medication.  MAKE SURE YOU:  Understand these instructions. Will watch your condition. Will get help right away if you are not doing well or get worse.    Thank you for choosing an e-visit.  Your e-visit answers were reviewed by a board certified advanced clinical practitioner to complete your personal care plan. Depending upon the condition, your plan could have included both over the counter or prescription medications.  Please review your pharmacy choice. Make sure the pharmacy is open so you can pick up  prescription now. If there is a problem, you may contact your provider through [employer] and have the prescription routed to another pharmacy.  Your safety is important to [place]. If you have drug allergies check your prescription carefully.   For the next 24 hours you can use MyChart to ask questions about today's visit, request a non-urgent call back, or ask for a work or school excuse. You will get an email in the next two days asking about your experience. I hope that your e-visit has been valuable and will speed your recovery.

## 2022-08-06 NOTE — Progress Notes (Signed)
I have spent 5 minutes in review of e-visit questionnaire, review and updating patient chart, medical decision making and response to patient.  ° °Marciana Uplinger W Jaret Coppedge, NP ° °  °

## 2022-08-06 NOTE — Addendum Note (Signed)
Addended by: Geryl Rankins on: 08/06/2022 05:16 PM   Modules accepted: Orders

## 2022-08-07 ENCOUNTER — Telehealth: Payer: Self-pay | Admitting: Physician Assistant

## 2022-08-07 DIAGNOSIS — J019 Acute sinusitis, unspecified: Secondary | ICD-10-CM

## 2022-08-07 DIAGNOSIS — B9689 Other specified bacterial agents as the cause of diseases classified elsewhere: Secondary | ICD-10-CM

## 2022-08-07 MED ORDER — AMOXICILLIN-POT CLAVULANATE 875-125 MG PO TABS: 1.0000 | ORAL_TABLET | Freq: Two times a day (BID) | ORAL | 0 refills | Status: DC

## 2022-08-07 MED ORDER — PROMETHAZINE HCL 25 MG PO TABS: 25.0000 mg | ORAL_TABLET | Freq: Two times a day (BID) | ORAL | 0 refills | Status: AC | PRN

## 2022-08-07 NOTE — Progress Notes (Signed)

## 2022-08-07 NOTE — Addendum Note (Signed)
Addended by: Mar Daring on: 08/07/2022 07:27 AM   Modules accepted: Orders

## 2022-08-25 ENCOUNTER — Ambulatory Visit: Payer: Self-pay | Admitting: Orthopaedic Surgery

## 2022-09-04 ENCOUNTER — Telehealth: Payer: Self-pay | Admitting: Physician Assistant

## 2022-09-04 DIAGNOSIS — B9689 Other specified bacterial agents as the cause of diseases classified elsewhere: Secondary | ICD-10-CM

## 2022-09-04 NOTE — Progress Notes (Signed)
Because this has been a recurrent issue over the last month, I feel your condition warrants further evaluation and I recommend that you be seen in a face to face visit.   NOTE: There will be NO CHARGE for this eVisit   If you are having a true medical emergency please call 911.      For an urgent face to face visit, [place] has eight urgent care centers for your convenience:   NEW!! [place] [facility] [place] Get Driving Directions [phone] [address] [place], [place] [facility] [place] Get Driving Directions [id number] [place], [place] [address]   [place] [facility]) Get Driving Directions [id number] [address], [place] [address]  [place] [facility] [place]) Get Driving Directions [id number] [address] [place],  [place]  [address]  [place] [facility] [place] [facility] at [place] Get Driving Directions  [phone] [address],  [place] [address]   [facility] [facility] Get Driving Directions [id number] [place], [place], [place] [address]   [place] [facility] [place] Get Driving Directions  [id number] [address] [place], [place] [address]   [place] [facility] [place] Get Driving Directions [id number] [address] [place], [place] [address]  Your MyChart E-visit questionnaire answers were reviewed by a board certified advanced clinical practitioner to complete your personal care plan based on your specific symptoms.  Thank you for using e-Visits.    I have spent 5 minutes in review of e-visit questionnaire, review and updating patient chart, medical decision making and response to patient.   Mar Daring, PA-C

## 2022-09-08 ENCOUNTER — Ambulatory Visit: Payer: Self-pay | Admitting: Orthopaedic Surgery

## 2022-09-22 ENCOUNTER — Ambulatory Visit: Payer: Self-pay | Admitting: Orthopaedic Surgery

## 2022-09-22 ENCOUNTER — Telehealth: Payer: Self-pay

## 2022-09-22 NOTE — Telephone Encounter (Signed)
Dr Roda Shutters would like patient discharged from practice-she has no showed 3 appointments with him.

## 2022-09-25 ENCOUNTER — Encounter: Payer: Self-pay | Admitting: Radiology

## 2022-09-25 NOTE — Telephone Encounter (Signed)
Letter generated, will send out by certified mail.

## 2022-11-09 ENCOUNTER — Telehealth: Payer: Self-pay | Admitting: Nurse Practitioner

## 2022-11-09 DIAGNOSIS — J069 Acute upper respiratory infection, unspecified: Secondary | ICD-10-CM

## 2022-11-10 MED ORDER — IPRATROPIUM BROMIDE 0.03 % NA SOLN: 2.0000 | Freq: Two times a day (BID) | NASAL | 12 refills | Status: AC

## 2022-11-10 NOTE — Progress Notes (Signed)
E-Visit for Upper Respiratory Infection   We are sorry you are not feeling well.  Here is how we plan to help!  Based on what you have shared with me, it looks like you may have a viral upper respiratory infection.  Upper respiratory infections are caused by a large number of viruses; however, rhinovirus is the most common cause.  We do not typically recommend antibiotics for URIs as they are viral, and prior to 7-10 of nasal congestion there is no indication for an antibiotic.  Providers prescribe antibiotics to treat infections caused by bacteria. Antibiotics are very powerful in treating bacterial infections when they are used properly. To maintain their effectiveness, they should be used only when necessary. Overuse of antibiotics has resulted in the development of superbugs that are resistant to treatment!    After careful review of your answers, I would not recommend an antibiotic for your condition.  Antibiotics are not effective against viruses and therefore should not be used to treat them. Common examples of infections caused by viruses include colds and flu  Symptoms vary from person to person, with common symptoms including sore throat, cough, fatigue or lack of energy and feeling of general discomfort.  A low-grade fever of up to 100.4 may present, but is often uncommon.  Symptoms vary however, and are closely related to a person's age or underlying illnesses.  The most common symptoms associated with an upper respiratory infection are nasal discharge or congestion, cough, sneezing, headache and pressure in the ears and face.  These symptoms usually persist for about 3 to 10 days, but can last up to 2 weeks.  It is important to know that upper respiratory infections do not cause serious illness or complications in most cases.    Upper respiratory infections can be transmitted from person to person, with the most common method of transmission being a person's hands.  The virus is able to live  on the skin and can infect other persons for up to 2 hours after direct contact.  Also, these can be transmitted when someone coughs or sneezes; thus, it is important to cover the mouth to reduce this risk.  To keep the spread of the illness at bay, good hand hygiene is very important.  This is an infection that is most likely caused by a virus. There are no specific treatments other than to help you with the symptoms until the infection runs its course.  We are sorry you are not feeling well.  Here is how we plan to help!   For nasal congestion, you may use an oral decongestants such as Mucinex D or if you have glaucoma or high blood pressure use plain Mucinex.  Saline nasal spray or nasal drops can help and can safely be used as often as needed for congestion.  For your congestion, I have prescribed Ipratropium Bromide nasal spray 0.03% two sprays in each nostril 2-3 times a day  If you do not have a history of heart disease, hypertension, diabetes or thyroid disease, prostate/bladder issues or glaucoma, you may also use Sudafed to treat nasal congestion.  It is highly recommended that you consult with a pharmacist or your primary care physician to ensure this medication is safe for you to take.     If you have a cough, you may use cough suppressants such as Delsym and Robitussin.  If you have glaucoma or high blood pressure, you can also use Coricidin HBP.     If you have  a sore or scratchy throat, use a saltwater gargle-  to  teaspoon of salt dissolved in a 4-ounce to 8-ounce glass of warm water.  Gargle the solution for approximately 15-30 seconds and then spit.  It is important not to swallow the solution.  You can also use throat lozenges/cough drops and Chloraseptic spray to help with throat pain or discomfort.  Warm or cold liquids can also be helpful in relieving throat pain.  For headache, pain or general discomfort, you can use Ibuprofen or Tylenol as directed.   Some authorities believe  that zinc sprays or the use of Echinacea may shorten the course of your symptoms.   HOME CARE Only take medications as instructed by your medical team. Be sure to drink plenty of fluids. Water is fine as well as fruit juices, sodas and electrolyte beverages. You may want to stay away from caffeine or alcohol. If you are nauseated, try taking small sips of liquids. How do you know if you are getting enough fluid? Your urine should be a pale yellow or almost colorless. Get rest. Taking a steamy shower or using a humidifier may help nasal congestion and ease sore throat pain. You can place a towel over your head and breathe in the steam from hot water coming from a faucet. Using a saline nasal spray works much the same way. Cough drops, hard candies and sore throat lozenges may ease your cough. Avoid close contacts especially the very young and the elderly Cover your mouth if you cough or sneeze Always remember to wash your hands.   GET HELP RIGHT AWAY IF: You develop worsening fever. If your symptoms do not improve within 10 days You develop yellow or green discharge from your nose over 3 days. You have coughing fits You develop a severe head ache or visual changes. You develop shortness of breath, difficulty breathing or start having chest pain Your symptoms persist after you have completed your treatment plan  MAKE SURE YOU  Understand these instructions. Will watch your condition. Will get help right away if you are not doing well or get worse.  Thank you for choosing an e-visit.  Your e-visit answers were reviewed by a board certified advanced clinical practitioner to complete your personal care plan. Depending upon the condition, your plan could have included both over the counter or prescription medications.  Please review your pharmacy choice. Make sure the pharmacy is open so you can pick up prescription now. If there is a problem, you may contact your provider through [employer] and have the prescription routed to another pharmacy.  Your safety is important to [place]. If you have drug allergies check your prescription carefully.   For the next 24 hours you can use MyChart to ask questions about today's visit, request a non-urgent call back, or ask for a work or school excuse. You will get an email in the next two days asking about your experience. I hope that your e-visit has been valuable and will speed your recovery.  I spent approximately 5 minutes reviewing the patient's history, current symptoms and coordinating their care today.

## 2022-11-14 ENCOUNTER — Telehealth: Payer: Self-pay | Admitting: Nurse Practitioner

## 2022-11-14 DIAGNOSIS — J324 Chronic pansinusitis: Secondary | ICD-10-CM

## 2022-11-14 NOTE — Progress Notes (Signed)
Because you already have a prescription for an antibiotic for this illness, and because we have already requested that you seek follow up with your primary care or a specialist for what has not become a chronic recurrent illness- I feel your condition warrants further evaluation and I recommend that you be seen for a face to face visit.  Please contact your primary care physician practice to be seen. Many offices offer virtual options to be seen via video if you are not comfortable going in person to a medical facility at this time.  NOTE: You will NOT be charged for this eVisit.  If you do not have a PCP, [facility] offers a free physician referral service available at [phone]. Our trained staff has the experience, knowledge and resources to put you in touch with a physician who is right for you.    If you are having a true medical emergency please call 911.   Your e-visit answers were reviewed by a board certified advanced clinical practitioner to complete your personal care plan.  Thank you for using e-Visits.

## 2022-12-11 ENCOUNTER — Ambulatory Visit: Payer: Self-pay

## 2022-12-24 ENCOUNTER — Telehealth: Payer: Self-pay | Admitting: Nurse Practitioner

## 2022-12-24 DIAGNOSIS — R112 Nausea with vomiting, unspecified: Secondary | ICD-10-CM

## 2022-12-25 MED ORDER — PROMETHAZINE HCL 25 MG PO TABS: 25.0000 mg | ORAL_TABLET | Freq: Two times a day (BID) | ORAL | 0 refills | Status: AC | PRN

## 2022-12-25 NOTE — Progress Notes (Signed)
E-Visit for Nausea and Vomiting   We are sorry that you are not feeling well. Here is how we plan to help!  Based on what you have shared with me it looks like you have a Virus that is irritating your GI tract.  Vomiting is the forceful emptying of a portion of the stomach's content through the mouth.  Although nausea and vomiting can make you feel miserable, it's important to remember that these are not diseases, but rather symptoms of an underlying illness.  When we treat short term symptoms, we always caution that any symptoms that persist should be fully evaluated in a medical office.  I have prescribed a medication that will help alleviate your symptoms and allow you to stay hydrated:  Promethazine 25 mg take 1 tablet twice daily  HOME CARE: Drink clear liquids.  This is very important! Dehydration (the lack of fluid) can lead to a serious complication.  Start off with 1 tablespoon every 5 minutes for 8 hours. You may begin eating bland foods after 8 hours without vomiting.  Start with saltine crackers, white bread, rice, mashed potatoes, applesauce. After 48 hours on a bland diet, you may resume a normal diet. Try to go to sleep.  Sleep often empties the stomach and relieves the need to vomit.  GET HELP RIGHT AWAY IF:  Your symptoms do not improve or worsen within 2 days after treatment. You have a fever for over 3 days. You cannot keep down fluids after trying the medication.  MAKE SURE YOU:  Understand these instructions. Will watch your condition. Will get help right away if you are not doing well or get worse.    Thank you for choosing an e-visit.  Your e-visit answers were reviewed by a board certified advanced clinical practitioner to complete your personal care plan. Depending upon the condition, your plan could have included both over the counter or prescription medications.  Please review your pharmacy choice. Make sure the pharmacy is open so you can pick up  prescription now. If there is a problem, you may contact your provider through [employer] and have the prescription routed to another pharmacy.  Your safety is important to [place]. If you have drug allergies check your prescription carefully.   For the next 24 hours you can use MyChart to ask questions about today's visit, request a non-urgent call back, or ask for a work or school excuse. You will get an email in the next two days asking about your experience. I hope that your e-visit has been valuable and will speed your recovery.   Meds ordered this encounter  Medications   promethazine (PHENERGAN) 25 MG tablet    Sig: Take 1 tablet (25 mg total) by mouth 2 (two) times daily as needed for up to 5 days for nausea or vomiting.    Dispense:  10 tablet    Refill:  0    I spent approximately 5 minutes reviewing the patient's history, current symptoms and coordinating their care today.

## 2023-02-11 ENCOUNTER — Ambulatory Visit: Payer: Self-pay

## 2023-02-11 ENCOUNTER — Telehealth: Payer: Self-pay | Admitting: Physician Assistant

## 2023-02-11 DIAGNOSIS — M545 Low back pain, unspecified: Secondary | ICD-10-CM

## 2023-02-11 DIAGNOSIS — H9202 Otalgia, left ear: Secondary | ICD-10-CM

## 2023-02-11 MED ORDER — AMOXICILLIN-POT CLAVULANATE 875-125 MG PO TABS: 1.0000 | ORAL_TABLET | Freq: Two times a day (BID) | ORAL | 0 refills | Status: DC

## 2023-02-11 MED ORDER — CYCLOBENZAPRINE HCL 10 MG PO TABS: 10.0000 mg | ORAL_TABLET | Freq: Three times a day (TID) | ORAL | 0 refills | Status: AC | PRN

## 2023-02-11 NOTE — Addendum Note (Signed)
Addended by: Christeen Douglas Z on: 02/11/2023 10:15 AM   Modules accepted: Orders

## 2023-02-11 NOTE — Progress Notes (Signed)

## 2023-03-29 ENCOUNTER — Other Ambulatory Visit: Payer: Self-pay | Admitting: Nurse Practitioner

## 2023-04-09 ENCOUNTER — Ambulatory Visit
Admission: EM | Admit: 2023-04-09 | Discharge: 2023-04-09 | Disposition: A | Discharge status: Home / Self Care | Payer: Commercial Managed Care - PPO | Attending: Internal Medicine | Admitting: Internal Medicine

## 2023-04-09 ENCOUNTER — Ambulatory Visit: Payer: Self-pay

## 2023-04-09 DIAGNOSIS — J329 Chronic sinusitis, unspecified: Secondary | ICD-10-CM | POA: Diagnosis not present

## 2023-04-09 MED ORDER — DEXAMETHASONE SODIUM PHOSPHATE 10 MG/ML IJ SOLN
10.0000 mg | Freq: Once | INTRAMUSCULAR | Status: AC
  Administered 2023-04-09: 10 mg via INTRAMUSCULAR

## 2023-04-09 MED ORDER — PREDNISONE 20 MG PO TABS: ORAL_TABLET | ORAL | 0 refills | Status: DC

## 2023-04-09 MED ORDER — PROMETHAZINE-DM 6.25-15 MG/5ML PO SYRP: 5.0000 mL | ORAL_SOLUTION | Freq: Three times a day (TID) | ORAL | 0 refills | Status: AC | PRN

## 2023-04-09 NOTE — ED Triage Notes (Addendum)
Pt c/o dry cough x 2 days-denies fever-states she had neg covid home test-NAD-steady gait-added she completed zpack last week for sinus infection

## 2023-04-09 NOTE — ED Provider Notes (Signed)
[facility]  Note:  This document was prepared using Conservation officer, historic buildings and may include unintentional dictation errors.  MRN: [id number] DOB: [birth year]  Subjective:   Ann Simon is a 32 y.o. female presenting for 2 days of recurrent dry cough, sinus drainage, symptoms worse at night.  Has a history of recurrent sinusitis, chronic sinusitis.  Has had sinus surgeries.  Works with an ENT specialist.  She just finished a Z-Pak.  Reports that historically for her when she continues to have symptoms does better with a prednisone course.  No chest pain, shortness of breath or wheezing.  No smoking of any kind including cigarettes, cigars, vaping, marijuana use.    No current facility-administered medications for this encounter.  Current Outpatient Medications:    acetaminophen (TYLENOL) 325 MG tablet, Take 650 mg by mouth every 6 (six) hours as needed., Disp: , Rfl:    amoxicillin-clavulanate (AUGMENTIN) 875-125 MG tablet, Take 1 tablet by mouth 2 (two) times daily., Disp: 20 tablet, Rfl: 0   azelastine (ASTELIN) 0.1 % nasal spray, Place into the nose., Disp: , Rfl:    cetirizine (ZYRTEC) 10 MG tablet, Take by mouth., Disp: , Rfl:    fluticasone (FLONASE) 50 MCG/ACT nasal spray, Place 2 sprays into both nostrils daily., Disp: 16 g, Rfl: 0   ipratropium (ATROVENT) 0.03 % nasal spray, Place 2 sprays into both nostrils every 12 (twelve) hours., Disp: 30 mL, Rfl: 12   levonorgestrel (MIRENA) 20 MCG/DAY IUD, by Intrauterine route., Disp: , Rfl:    loratadine (CLARITIN) 10 MG tablet, Take 10 mg by mouth daily., Disp: , Rfl:    methocarbamol (ROBAXIN) 500 MG tablet, Take 500 mg by mouth 3 (three) times daily., Disp: , Rfl:    promethazine (PHENERGAN) 25 MG tablet, Take 1 tablet (25 mg total) by mouth every 12 (twelve) hours as needed for nausea or vomiting., Disp: 20 tablet, Rfl: 0   promethazine (PHENERGAN) 25 MG tablet, Take 1 tablet (25 mg total) by mouth 2  (two) times daily as needed for up to 5 days for nausea or vomiting., Disp: 10 tablet, Rfl: 0   Allergies  Allergen Reactions   Varicella Virus Vaccine Live Nausea And Vomiting    Pt got ill after immunization Pt got ill after immunization    Zofran [Ondansetron] Other (See Comments)    Severe headache    History reviewed. No pertinent past medical history.   Past Surgical History:  Procedure Laterality Date   ANTERIOR CRUCIATE LIGAMENT REPAIR Bilateral    CESAREAN SECTION     CHOLECYSTECTOMY     NASAL SINUS SURGERY      Family History  Problem Relation Age of Onset   Healthy Mother    Healthy Father    Hypertension Mother     Social History   Tobacco Use   Smoking status: Never   Smokeless tobacco: Never  Vaping Use   Vaping status: Never Used  Substance Use Topics   Alcohol use: Not Currently   Drug use: Never    ROS   Objective:   Vitals: BP 117/73 (BP Location: Right Arm)   Pulse 68   Temp 97.6 F (36.4 C) (Oral)   Resp 16   SpO2 99%   Physical Exam Constitutional:      General: She is not in acute distress.    Appearance: Normal appearance. She is well-developed. She is not ill-appearing, toxic-appearing or diaphoretic.  HENT:     Head: Normocephalic and  atraumatic.     Right Ear: External ear normal.     Left Ear: External ear normal.     Nose: Congestion present. No rhinorrhea.     Mouth/Throat:     Mouth: Mucous membranes are moist.     Comments: Cobblestone pattern postnasal drainage overlying pharynx. Eyes:     General: No scleral icterus.       Right eye: No discharge.        Left eye: No discharge.     Extraocular Movements: Extraocular movements intact.  Cardiovascular:     Rate and Rhythm: Normal rate and regular rhythm.     Heart sounds: Normal heart sounds. No murmur heard.    No friction rub. No gallop.  Pulmonary:     Effort: Pulmonary effort is normal. No respiratory distress.     Breath sounds: No stridor. No wheezing,  rhonchi or rales.  Chest:     Chest wall: No tenderness.  Skin:    General: Skin is warm and dry.  Neurological:     General: No focal deficit present.     Mental Status: She is alert and oriented to person, place, and time.  Psychiatric:        Mood and Affect: Mood normal.        Behavior: Behavior normal.    IM dexamethasone 10 mg administered in clinic.  Assessment and Plan :   PDMP not reviewed this encounter.  1. Recurrent sinusitis   2. Chronic sinusitis, unspecified location    Acute on chronic recurrent sinusitis.  Recommended IM steroid as above, followed by oral prednisone starting tomorrow.  Maintain allergy regimen.  Follow-up with her ENT specialist as soon as possible.  Deferred imaging given clear cardiopulmonary exam, hemodynamically stable vital signs.  Counseled patient on potential for adverse effects with medications prescribed/recommended today, ER and return-to-clinic precautions discussed, patient verbalized understanding.    Wallis Bamberg, PA-C 04/09/23 0930

## 2023-04-25 ENCOUNTER — Telehealth: Payer: Commercial Managed Care - PPO | Admitting: Family Medicine

## 2023-04-25 DIAGNOSIS — Z20818 Contact with and (suspected) exposure to other bacterial communicable diseases: Secondary | ICD-10-CM

## 2023-04-25 DIAGNOSIS — J029 Acute pharyngitis, unspecified: Secondary | ICD-10-CM

## 2023-04-25 MED ORDER — LIDOCAINE VISCOUS HCL 2 % MT SOLN: 15.0000 mL | OROMUCOSAL | 0 refills | Status: AC | PRN

## 2023-04-25 MED ORDER — AMOXICILLIN 500 MG PO TABS: 500.0000 mg | ORAL_TABLET | Freq: Two times a day (BID) | ORAL | 0 refills | Status: AC

## 2023-04-25 NOTE — Progress Notes (Signed)
E-Visit for Sore Throat - Strep Symptoms  We are sorry that you are not feeling well.  Here is how we plan to help!  Based on what you have shared with me it is likely that you have strep pharyngitis.  Strep pharyngitis is inflammation and infection in the back of the throat.  This is an infection cause by bacteria and is treated with antibiotics.  I have prescribed Amoxicillin 500 mg twice a day for 10 days and 2% Viscous Lidocaine 5 ml gargle and swallow every 4 hours as needed for throat pain. For throat pain, we recommend over the counter oral pain relief medications such as acetaminophen or aspirin, or anti-inflammatory medications such as ibuprofen or naproxen sodium. Topical treatments such as oral throat lozenges or sprays may be used as needed. Strep infections are not as easily transmitted as other respiratory infections, however we still recommend that you avoid close contact with loved ones, especially the very young and elderly.  Remember to wash your hands thoroughly throughout the day as this is the number one way to prevent the spread of infection and wipe down door knobs and counters with disinfectant.   Home Care: Only take medications as instructed by your medical team. Complete the entire course of an antibiotic. Do not take these medications with alcohol. A steam or ultrasonic humidifier can help congestion.  You can place a towel over your head and breathe in the steam from hot water coming from a faucet. Avoid close contacts especially the very young and the elderly. Cover your mouth when you cough or sneeze. Always remember to wash your hands.  Get Help Right Away If: You develop worsening fever or sinus pain. You develop a severe head ache or visual changes. Your symptoms persist after you have completed your treatment plan.  Make sure you Understand these instructions. Will watch your condition. Will get help right away if you are not doing well or get  worse.   Thank you for choosing an e-visit.  Your e-visit answers were reviewed by a board certified advanced clinical practitioner to complete your personal care plan. Depending upon the condition, your plan could have included both over the counter or prescription medications.  Please review your pharmacy choice. Make sure the pharmacy is open so you can pick up prescription now. If there is a problem, you may contact your provider through [employer] and have the prescription routed to another pharmacy.  Your safety is important to [place]. If you have drug allergies check your prescription carefully.   For the next 24 hours you can use MyChart to ask questions about today's visit, request a non-urgent call back, or ask for a work or school excuse. You will get an email in the next two days asking about your experience. I hope that your e-visit has been valuable and will speed your recovery.  I provided 5 minutes of non face-to-face time during this encounter for chart review, medication and order placement, as well as and documentation.

## 2023-05-20 ENCOUNTER — Telehealth: Payer: Commercial Managed Care - PPO | Admitting: Family

## 2023-05-20 DIAGNOSIS — J019 Acute sinusitis, unspecified: Secondary | ICD-10-CM

## 2023-05-20 MED ORDER — PREDNISONE 10 MG (21) PO TBPK: ORAL_TABLET | ORAL | 0 refills | Status: AC

## 2023-05-20 MED ORDER — AZITHROMYCIN 250 MG PO TABS: ORAL_TABLET | ORAL | 0 refills | Status: DC

## 2023-05-20 MED ORDER — DOXYCYCLINE HYCLATE 100 MG PO TABS: 100.0000 mg | ORAL_TABLET | Freq: Two times a day (BID) | ORAL | 0 refills | Status: DC

## 2023-05-20 NOTE — Progress Notes (Signed)

## 2023-05-20 NOTE — Addendum Note (Signed)
Addended by: Jannifer Rodney A on: 05/20/2023 11:41 AM   Modules accepted: Orders

## 2023-06-29 ENCOUNTER — Telehealth: Payer: Self-pay | Admitting: Family Medicine

## 2023-06-29 DIAGNOSIS — B9689 Other specified bacterial agents as the cause of diseases classified elsewhere: Secondary | ICD-10-CM

## 2023-06-29 DIAGNOSIS — J019 Acute sinusitis, unspecified: Secondary | ICD-10-CM

## 2023-06-29 MED ORDER — AZITHROMYCIN 250 MG PO TABS: ORAL_TABLET | ORAL | 0 refills | Status: AC

## 2023-06-29 NOTE — Progress Notes (Signed)
E-Visit for Sinus Problems  We are sorry that you are not feeling well.  Here is how we plan to help!  Based on what you have shared with me it looks like you have sinusitis.  Sinusitis is inflammation and infection in the sinus cavities of the head.  Based on your presentation I believe you most likely have Acute Bacterial Sinusitis.  This is an infection caused by bacteria and is treated with antibiotics. I have prescribed azithromycin.  You may use an oral decongestant such as Mucinex D or if you have glaucoma or high blood pressure use plain Mucinex. Saline nasal spray help and can safely be used as often as needed for congestion.  If you develop worsening sinus pain, fever or notice severe headache and vision changes, or if symptoms are not better after completion of antibiotic, please schedule an appointment with a health care provider.    Sinus infections are not as easily transmitted as other respiratory infection, however we still recommend that you avoid close contact with loved ones, especially the very young and elderly.  Remember to wash your hands thoroughly throughout the day as this is the number one way to prevent the spread of infection!  Home Care: Only take medications as instructed by your medical team. Complete the entire course of an antibiotic. Do not take these medications with alcohol. A steam or ultrasonic humidifier can help congestion.  You can place a towel over your head and breathe in the steam from hot water coming from a faucet. Avoid close contacts especially the very young and the elderly. Cover your mouth when you cough or sneeze. Always remember to wash your hands.  Get Help Right Away If: You develop worsening fever or sinus pain. You develop a severe head ache or visual changes. Your symptoms persist after you have completed your treatment plan.  Make sure you Understand these instructions. Will watch your condition. Will get help right away if you are  not doing well or get worse.  Thank you for choosing an e-visit.  Your e-visit answers were reviewed by a board certified advanced clinical practitioner to complete your personal care plan. Depending upon the condition, your plan could have included both over the counter or prescription medications.  Please review your pharmacy choice. Make sure the pharmacy is open so you can pick up prescription now. If there is a problem, you may contact your provider through [employer] and have the prescription routed to another pharmacy.  Your safety is important to [place]. If you have drug allergies check your prescription carefully.   For the next 24 hours you can use MyChart to ask questions about today's visit, request a non-urgent call back, or ask for a work or school excuse. You will get an email in the next two days asking about your experience. I hope that your e-visit has been valuable and will speed your recovery.    have provided 5 minutes of non face to face time during this encounter for chart review and documentation.

## 2023-08-25 ENCOUNTER — Telehealth: Payer: Self-pay | Admitting: Nurse Practitioner

## 2023-08-25 DIAGNOSIS — M545 Low back pain, unspecified: Secondary | ICD-10-CM

## 2023-08-25 DIAGNOSIS — S39012A Strain of muscle, fascia and tendon of lower back, initial encounter: Secondary | ICD-10-CM

## 2023-08-25 MED ORDER — CYCLOBENZAPRINE HCL 5 MG PO TABS: 5.0000 mg | ORAL_TABLET | Freq: Three times a day (TID) | ORAL | 1 refills | Status: AC | PRN

## 2023-08-25 MED ORDER — NAPROXEN 500 MG PO TABS: 500.0000 mg | ORAL_TABLET | Freq: Two times a day (BID) | ORAL | 0 refills | Status: AC

## 2023-08-25 NOTE — Progress Notes (Signed)
 I have spent 5 minutes in review of e-visit questionnaire, review and updating patient chart, medical decision making and response to patient.   Claiborne Rigg, NP

## 2023-08-25 NOTE — Progress Notes (Signed)
 E-Visit for Back Pain   We are sorry that you are not feeling well.  Here is how we plan to help!  You have had several evisits over the past year for back pain. I do recommend you establish care with a community clinic in your area or the health department for no charge in order to have a full examination and treatment of your back.   Based on what you have shared with me it looks like you mostly have acute back pain.  Acute back pain is defined as musculoskeletal pain that can resolve in 1-3 weeks with conservative treatment.  I have prescribed Naprosyn 500 mg take one by mouth twice a day non-steroid anti-inflammatory (NSAID) as well as Flexeril 10 mg every eight hours as needed which is a muscle relaxer  Some patients experience stomach irritation or in increased heartburn with anti-inflammatory drugs.  Please keep in mind that muscle relaxer's can cause fatigue and should not be taken while at work or driving.  Back pain is very common.  The pain often gets better over time.  The cause of back pain is usually not dangerous.  Most people can learn to manage their back pain on their own.  Home Care Stay active.  Start with short walks on flat ground if you can.  Try to walk farther each day. Do not sit, drive or stand in one place for more than 30 minutes.  Do not stay in bed. Do not avoid exercise or work.  Activity can help your back heal faster. Be careful when you bend or lift an object.  Bend at your knees, keep the object close to you, and do not twist. Sleep on a firm mattress.  Lie on your side, and bend your knees.  If you lie on your back, put a pillow under your knees. Only take medicines as told by your doctor. Put ice on the injured area. Put ice in a plastic bag Place a towel between your skin and the bag Leave the ice on for 15-20 minutes, 3-4 times a day for the first 2-3 days. 210 After that, you can switch between ice and heat packs. Ask your doctor about back exercises or  massage. Avoid feeling anxious or stressed.  Find good ways to deal with stress, such as exercise.  Get Help Right Way If: Your pain does not go away with rest or medicine. Your pain does not go away in 1 week. You have new problems. You do not feel well. The pain spreads into your legs. You cannot control when you poop (bowel movement) or pee (urinate) You feel sick to your stomach (nauseous) or throw up (vomit) You have belly (abdominal) pain. You feel like you may pass out (faint). If you develop a fever.  Make Sure you: Understand these instructions. Will watch your condition Will get help right away if you are not doing well or get worse.  Your e-visit answers were reviewed by a board certified advanced clinical practitioner to complete your personal care plan.  Depending on the condition, your plan could have included both over the counter or prescription medications.  If there is a problem please reply  once you have received a response from your provider.  Your safety is important to [place].  If you have drug allergies check your prescription carefully.    You can use MyChart to ask questions about today's visit, request a non-urgent call back, or ask for a work or school excuse  for 24 hours related to this e-Visit. If it has been greater than 24 hours you will need to follow up with your provider, or enter a new e-Visit to address those concerns.  You will get an e-mail in the next two days asking about your experience.  I hope that your e-visit has been valuable and will speed your recovery. Thank you for using e-visits.

## 2023-09-12 ENCOUNTER — Other Ambulatory Visit: Payer: Self-pay | Admitting: Family

## 2023-09-12 ENCOUNTER — Telehealth: Payer: Self-pay | Admitting: Physician Assistant

## 2023-09-12 DIAGNOSIS — K047 Periapical abscess without sinus: Secondary | ICD-10-CM

## 2023-09-12 MED ORDER — AMOXICILLIN-POT CLAVULANATE 875-125 MG PO TABS: 1.0000 | ORAL_TABLET | Freq: Two times a day (BID) | ORAL | 0 refills | Status: AC

## 2023-09-12 MED ORDER — IBUPROFEN 600 MG PO TABS: 600.0000 mg | ORAL_TABLET | Freq: Three times a day (TID) | ORAL | 0 refills | Status: AC | PRN

## 2023-09-12 NOTE — Progress Notes (Signed)
 E-Visit for Dental Pain  We are sorry that you are not feeling well.  Here is how we plan to help!  Based on what you have shared with me in the questionnaire, it sounds like you have a dental infection from wisdom teeth coming in.  Augmentin 875-125mg  twice a day for 7 days and Ibuprofen 600mg  3 times a day for 7 days for discomfort  It is imperative that you see a dentist within 10 days of this eVisit to determine the cause of the dental pain and be sure it is adequately treated  A toothache or tooth pain is caused when the nerve in the root of a tooth or surrounding a tooth is irritated. Dental (tooth) infection, decay, injury, or loss of a tooth are the most common causes of dental pain. Pain may also occur after an extraction (tooth is pulled out). Pain sometimes originates from other areas and radiates to the jaw, thus appearing to be tooth pain.Bacteria growing inside your mouth can contribute to gum disease and dental decay, both of which can cause pain. A toothache occurs from inflammation of the central portion of the tooth called pulp. The pulp contains nerve endings that are very sensitive to pain. Inflammation to the pulp or pulpitis may be caused by dental cavities, trauma, and infection.    HOME CARE:   For toothaches: Over-the-counter pain medications such as acetaminophen or ibuprofen may be used. Take these as directed on the package while you arrange for a dental appointment. Avoid very cold or hot foods, because they may make the pain worse. You may get relief from biting on a cotton ball soaked in oil of cloves. You can get oil of cloves at most drug stores.  For jaw pain:  Aspirin may be helpful for problems in the joint of the jaw in adults. If pain happens every time you open your mouth widely, the temporomandibular joint (TMJ) may be the source of the pain. Yawning or taking a large bite of food may worsen the pain. An appointment with your doctor or dentist will help  you find the cause.     GET HELP RIGHT AWAY IF:  You have a high fever or chills If you have had a recent head or face injury and develop headache, light headedness, nausea, vomiting, or other symptoms that concern you after an injury to your face or mouth, you could have a more serious injury in addition to your dental injury. A facial rash associated with a toothache: This condition may improve with medication. Contact your doctor for them to decide what is appropriate. Any jaw pain occurring with chest pain: Although jaw pain is most commonly caused by dental disease, it is sometimes referred pain from other areas. People with heart disease, especially people who have had stents placed, people with diabetes, or those who have had heart surgery may have jaw pain as a symptom of heart attack or angina. If your jaw or tooth pain is associated with lightheadedness, sweating, or shortness of breath, you should see a doctor as soon as possible. Trouble swallowing or excessive pain or bleeding from gums: If you have a history of a weakened immune system, diabetes, or steroid use, you may be more susceptible to infections. Infections can often be more severe and extensive or caused by unusual organisms. Dental and gum infections in people with these conditions may require more aggressive treatment. An abscess may need draining or IV antibiotics, for example.  MAKE SURE YOU  Understand these instructions. Will watch your condition. Will get help right away if you are not doing well or get worse.  Thank you for choosing an e-visit.  Your e-visit answers were reviewed by a board certified advanced clinical practitioner to complete your personal care plan. Depending upon the condition, your plan could have included both over the counter or prescription medications.  Please review your pharmacy choice. Make sure the pharmacy is open so you can pick up prescription now. If there is a problem, you may  contact your provider through [employer] and have the prescription routed to another pharmacy.  Your safety is important to [place]. If you have drug allergies check your prescription carefully.   For the next 24 hours you can use MyChart to ask questions about today's visit, request a non-urgent call back, or ask for a work or school excuse. You will get an email in the next two days asking about your experience. I hope that your e-visit has been valuable and will speed your recovery.   I have spent 5 minutes in review of e-visit questionnaire, review and updating patient chart, medical decision making and response to patient.   Margaretann Loveless, PA-C

## 2023-11-04 ENCOUNTER — Ambulatory Visit: Payer: Self-pay

## 2024-02-04 ENCOUNTER — Other Ambulatory Visit: Payer: Self-pay | Admitting: Nurse Practitioner

## 2024-02-04 DIAGNOSIS — M545 Low back pain, unspecified: Secondary | ICD-10-CM

## 2024-02-05 NOTE — Telephone Encounter (Signed)
 [facility] patient Requested Prescriptions  Pending Prescriptions Disp Refills   cyclobenzaprine  (FLEXERIL ) 5 MG tablet [Pharmacy Med Name: CYCLOBENZAPRINE  5MG  TABLETS] 30 tablet 1    Sig: TAKE 1 TABLET(5 MG) BY MOUTH THREE TIMES DAILY AS NEEDED FOR MUSCLE SPASMS     Not Delegated - Analgesics:  Muscle Relaxants Failed - 02/05/2024  3:00 PM      Failed - This refill cannot be delegated      Failed - Valid encounter within last 6 months    Recent Outpatient Visits   None

## 2024-03-21 IMAGING — CR DG ELBOW COMPLETE 3+V*R*
4 series · 4 of 4 positions shown · non-contrast
Comparison: None Available.

CLINICAL DATA: right elbow pain

EXAM:
RIGHT ELBOW - COMPLETE 3+ VIEW

[x elbow joint ap right]
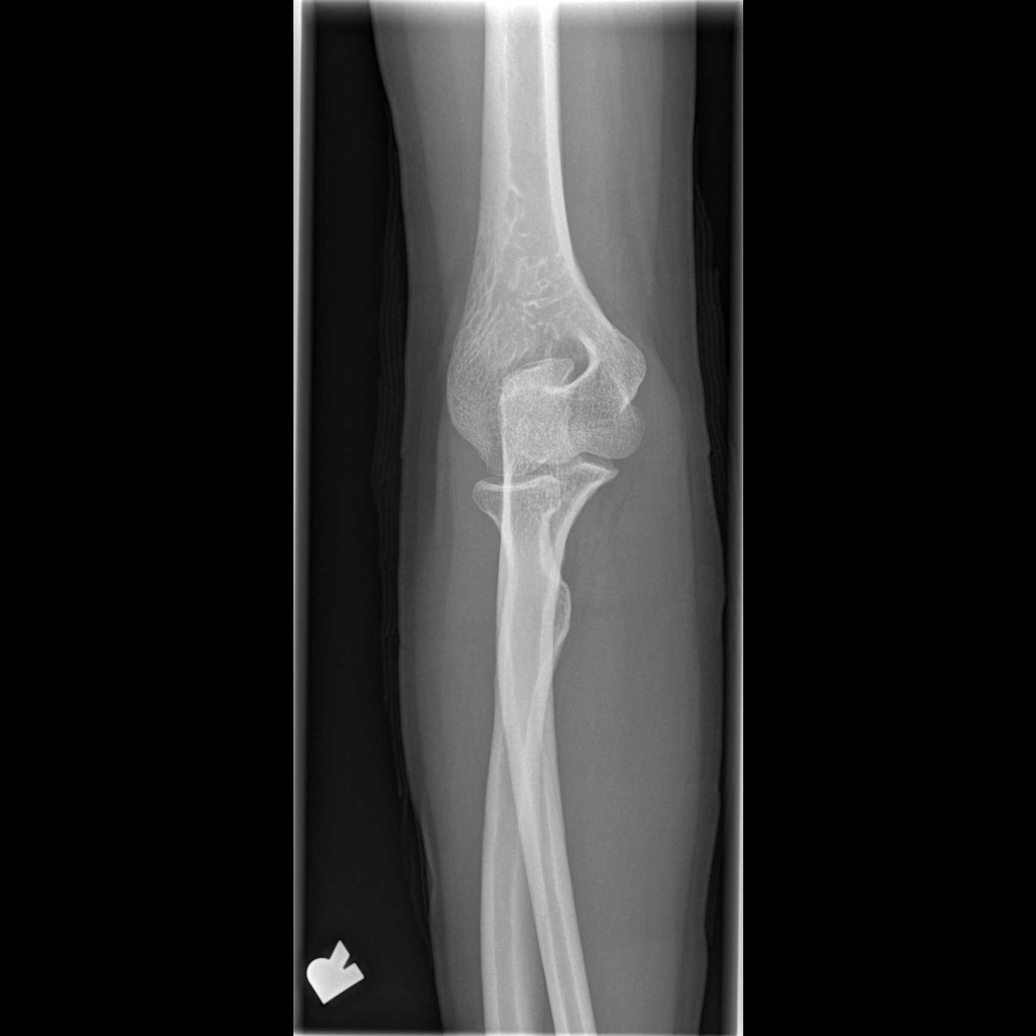

[x elbow joint obl. right (1 of 2)]
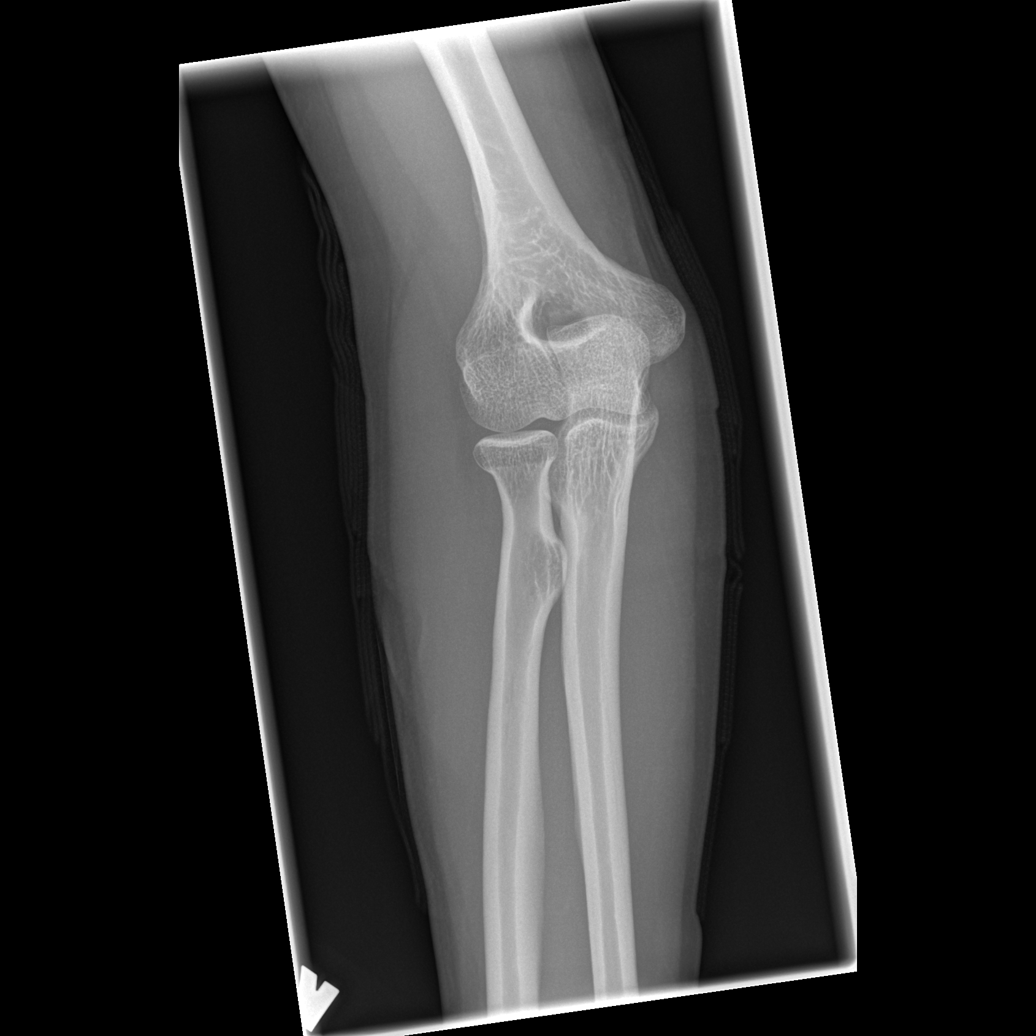

[x elbow joint obl. right (2 of 2)]
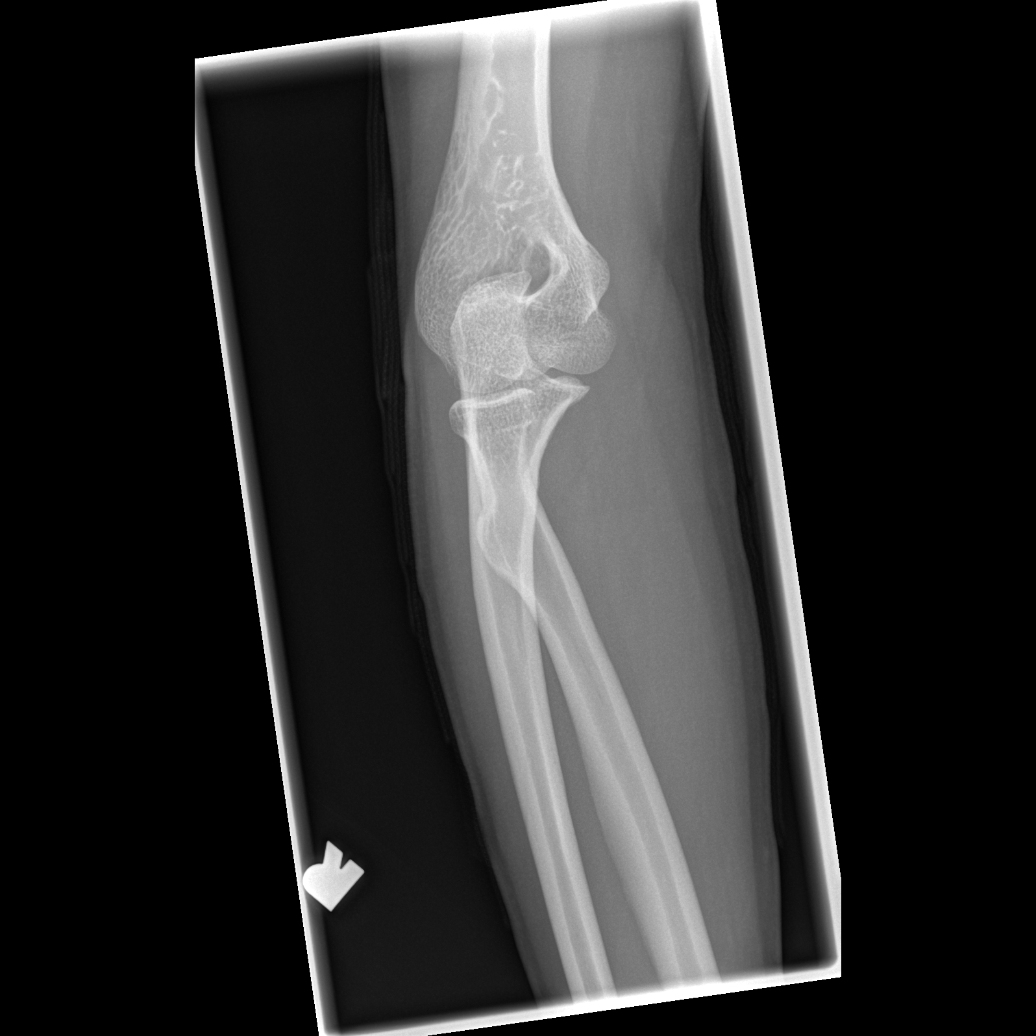

[x elbow joint lat right]
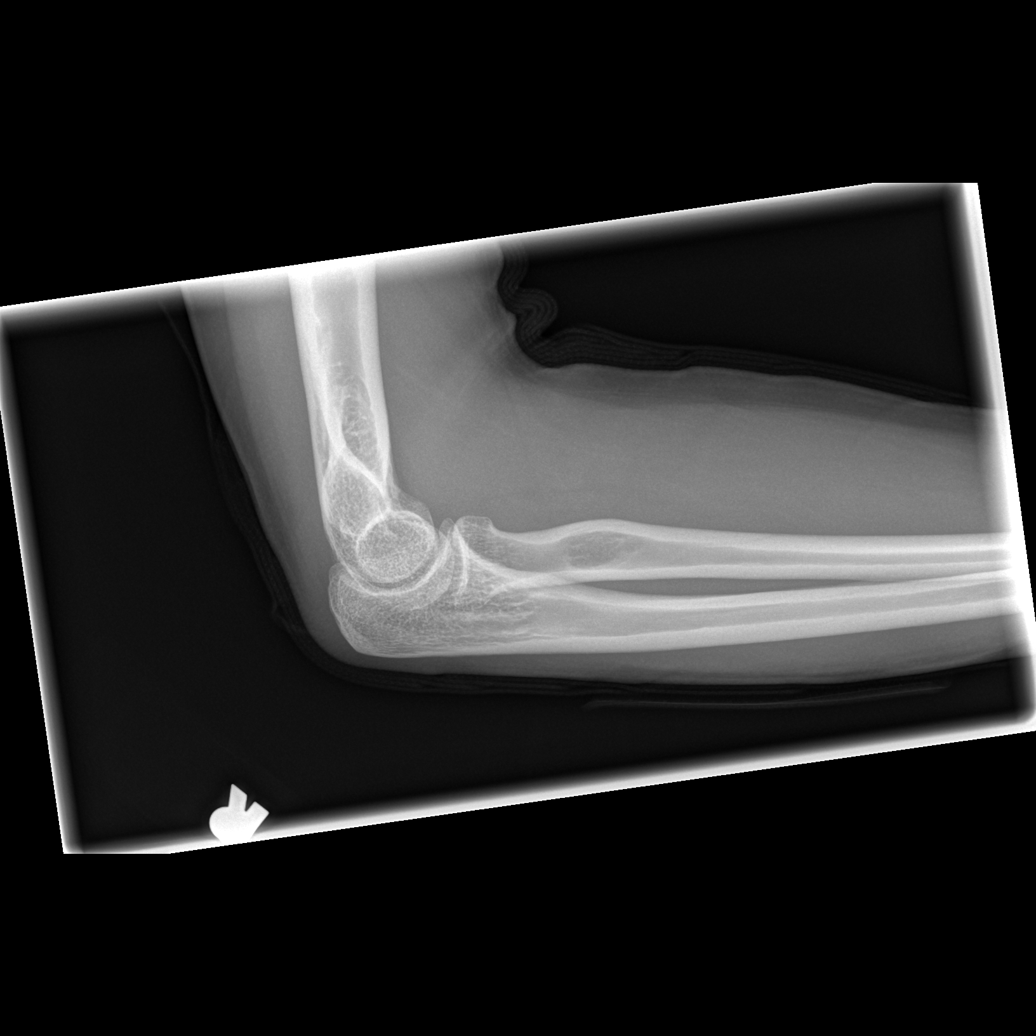

[4 of 4 positions shown; findings below may reference images not displayed]

FINDINGS: Normal alignment. No acute fracture. Normal mineralization. The soft
tissues are unremarkable. No joint effusion.
IMPRESSION: No acute osseous abnormality.
